# Patient Record
Sex: Male | Born: 1940 | Hispanic: Yes | Marital: Married | Smoking: Former smoker
Health system: Southern US, Community
[De-identification: ages and names within clinical notes are randomized; demographics above are authoritative.]

## PROBLEM LIST (undated history)

## (undated) DIAGNOSIS — I10 Essential (primary) hypertension: Secondary | ICD-10-CM

## (undated) DIAGNOSIS — Z8709 Personal history of other diseases of the respiratory system: Secondary | ICD-10-CM

## (undated) DIAGNOSIS — J449 Chronic obstructive pulmonary disease, unspecified: Secondary | ICD-10-CM

## (undated) DIAGNOSIS — Z87448 Personal history of other diseases of urinary system: Secondary | ICD-10-CM

## (undated) DIAGNOSIS — R0609 Other forms of dyspnea: Secondary | ICD-10-CM

## (undated) DIAGNOSIS — N401 Enlarged prostate with lower urinary tract symptoms: Secondary | ICD-10-CM

## (undated) DIAGNOSIS — N471 Phimosis: Secondary | ICD-10-CM

## (undated) DIAGNOSIS — Z86010 Personal history of colonic polyps: Secondary | ICD-10-CM

## (undated) DIAGNOSIS — Z9289 Personal history of other medical treatment: Secondary | ICD-10-CM

## (undated) DIAGNOSIS — M1990 Unspecified osteoarthritis, unspecified site: Secondary | ICD-10-CM

## (undated) DIAGNOSIS — N210 Calculus in bladder: Secondary | ICD-10-CM

## (undated) DIAGNOSIS — R0600 Dyspnea, unspecified: Secondary | ICD-10-CM

## (undated) DIAGNOSIS — K5730 Diverticulosis of large intestine without perforation or abscess without bleeding: Secondary | ICD-10-CM

---

## 2009-06-07 ENCOUNTER — Ambulatory Visit (HOSPITAL_COMMUNITY): Admission: RE | Admit: 2009-06-07 | Discharge: 2009-06-07 | Payer: Self-pay | Admitting: Urology

## 2009-06-07 ENCOUNTER — Encounter (INDEPENDENT_AMBULATORY_CARE_PROVIDER_SITE_OTHER): Payer: Self-pay | Admitting: Urology

## 2009-06-07 HISTORY — PX: CYSTOSCOPY WITH LITHOLAPAXY: SHX1425

## 2011-01-03 LAB — CBC
HCT: 47.6 % (ref 39.0–52.0)
Hemoglobin: 17.1 g/dL — ABNORMAL HIGH (ref 13.0–17.0)
MCH: 32.3 pg (ref 26.0–34.0)
MCV: 90 fL (ref 78.0–100.0)
Platelets: 176 10*3/uL (ref 150–400)
RBC: 5.29 MIL/uL (ref 4.22–5.81)

## 2011-01-03 LAB — BASIC METABOLIC PANEL
BUN: 15 mg/dL (ref 6–23)
CO2: 28 mEq/L (ref 19–32)
Chloride: 101 mEq/L (ref 96–112)
Creatinine, Ser: 1.02 mg/dL (ref 0.4–1.5)
Glucose, Bld: 95 mg/dL (ref 70–99)

## 2011-01-08 ENCOUNTER — Ambulatory Visit (HOSPITAL_COMMUNITY)
Admission: RE | Admit: 2011-01-08 | Discharge: 2011-01-08 | Payer: Self-pay | Source: Home / Self Care | Attending: General Surgery | Admitting: General Surgery

## 2011-01-08 HISTORY — PX: UMBILICAL HERNIA REPAIR: SHX196

## 2011-01-24 NOTE — Op Note (Signed)
NAMEAJ, CRUNKLETON ACCOUNT NO.:  [id number]  MEDICAL RECORD NO.:  [id number]          PATIENT TYPE:  AMB  LOCATION:  DAY                           FACILITY:  [facility]  PHYSICIAN:  Tilford Pillar, MD      DATE OF BIRTH:  [birth year]  DATE OF PROCEDURE:  01/08/2011 DATE OF DISCHARGE:                              OPERATIVE REPORT   PREOPERATIVE DIAGNOSIS:  Incarcerated umbilical hernia.  POSTOPERATIVE DIAGNOSIS:  Incarcerated umbilical hernia.  PROCEDURE:  Open umbilical hernia repair with primary closure.  SURGEON:  Tilford Pillar, MD  ANESTHESIA:  General endotracheal, local anesthetic 0.5% Sensorcaine plain.  SPECIMEN:  None.  ESTIMATED BLOOD LOSS:  Minimal.  INDICATIONS:  The patient is a 70 year old Hispanic male who presented to my office with a history of several years of umbilical hernia.  This was slowly increased in size and is now getting him some discomfort.  On evaluation, there was incarcerated material within the umbilical hernia that is nonreducible, no significant tenderness or erythema was encountered.  The risks, benefits, and alternatives of an open umbilical hernia repair with possible mesh was discussed at length with the patient and family.  Their questions and concerns were addressed and the patient was consented for the planned procedure.  OPERATION:  The patient was taken to the operating room, was placed in the supine position on the operating room table, at which time the general anesthetic was administered.  Once the patient was sleepy, the endotracheal was intubated by Anesthesia.  At this point, his abdomen was prepped with DuraPrep solution and draped in standard fashion.  A infraumbilical semilunar incision was created with a scalpel, additional dissection down to the subcuticular tissue was carried out using electrocautery and then this was carried out down to the fascia.  A hemostat was then utilized to bluntly dissect around the  hernia sac in umbilical stalk.  The umbilical skin was then dissected free from the hernia sac.  During the dissection, I did enter into the hernia sac. There was omental fat noted within the hernia sac.  The umbilical skin was completely dissected free from the underlying peritoneal sac and at this time, I cleared the fascia circumferentially around the redundant sac.  I did remove and discard some of the excess umbilical sac and at this point, I reduced the omental fat back into the peritoneal cavity. At this point, the defect was quite small, is approximately 1.5 cm in length, an easily able to be reapproximated.  I then used 0 Ethibond sutures in simple interrupted fashion to reapproximate the fascia.  I was quite pleased with the repair.  Local anesthetic was instilled.  The wound was irrigated and a 3-0 Vicryl suture was utilized to reapproximate the umbilical skin back to the fascia with the umbilical stalk re-pexed.  I then placed some additional 3-0 Vicryl sutures for reapproximate the deep subcuticular tissue.  The skin edges were reapproximated using a 4-0 Monocryl in a running subcuticular suture. The skin was washed and dried with a moist and dry towel.  Benzoin was applied around the incision.  A 0.5-inch Steri-Strips were placed.  The drapes were removed.  The patient was allowed to come out of  general anesthetic and was transferred back to regular hospital bed in stable condition.  At the conclusion of the procedure, all instrument, sponge, and needle counts were correct.  The patient tolerated the procedure extremely well.     Tilford Pillar, MD     BZ/MEDQ  D:  01/08/2011  T:  01/08/2011  Job:  [id number]  Electronically Signed by Tilford Pillar MD on 01/24/2011 11:10:15 AM

## 2011-03-19 LAB — BASIC METABOLIC PANEL
BUN: 17 mg/dL (ref 6–23)
CO2: 29 mEq/L (ref 19–32)
Chloride: 98 mEq/L (ref 96–112)
Glucose, Bld: 84 mg/dL (ref 70–99)
Potassium: 3.7 mEq/L (ref 3.5–5.1)

## 2011-03-19 LAB — STONE ANALYSIS: Stone Weight KSTONE: 2.801 g

## 2011-04-24 NOTE — H&P (Signed)
NAMEMerlene Cortez ACCOUNT NO.:  [id number]   MEDICAL RECORD NO.:  [id number]          PATIENT TYPE:  AMB   LOCATION:  DAY                           FACILITY:  [facility]   PHYSICIAN:  Ky Barban, M.D.DATE OF BIRTH:  [birth year]   DATE OF ADMISSION:  DATE OF DISCHARGE:  LH                              HISTORY & PHYSICAL   CHIEF COMPLAINT:  This patient is coming in the morning to have his  surgery done.   HISTORY OF PRESENT ILLNESS:  This patient is a 70 year old gentleman who  has a longstanding history of difficulty voiding.  He has been to the  emergency room in [place] where he had a CT scan done that shows that  he has normal upper tracts with multiple small stones in the bladder, so  this is the first time I have seen him in the office and he has a Foley  catheter.  I have advised him to undergo litholapaxy for which he will  be coming in the morning to [facility] so that I can do  litholapaxy as outpatient.   PAST MEDICAL HISTORY:  The patient does not speak English through the  interpreter.  She told me that he does not have any significant medical  or past medical problems.   EXAMINATION:  GENERAL:  Well-nourished, well-developed male not in acute  distress, fully conscious, alert, oriented.  VITAL SIGNS:  Blood pressure 108/68, temperature 97.  CENTRAL NERVOUS SYSTEM:  Negative.  HEAD/NECK/ENT: Negative.  CHEST:  Symmetrical.  HEART:  Regular sinus rhythm, no murmur.  ABDOMEN:  Soft, flat.  Liver, spleen, kidneys not palpable.  No CVA  tenderness.  EXTERNAL GENITALIA:  Uncircumcised.  Meatus adequate.  Testicles are  normal.  RECTAL EXAM:  Normal sphincter tone.  Prostate 1+ , smooth and firm.   IMPRESSION:  Bladder calculi.   PLAN:  Litholapaxy holmium as outpatient under anesthesia.      Ky Barban, M.D.  Electronically Signed     MIJ/MEDQ  D:  06/06/2009  T:  06/06/2009  Job:  [id number]

## 2011-04-24 NOTE — Op Note (Signed)
NAMEJENTRY, Martin Cortez ACCOUNT NO.:  [id number]   MEDICAL RECORD NO.:  [id number]          PATIENT TYPE:  AMB   LOCATION:  DAY                           FACILITY:  [facility]   PHYSICIAN:  Ky Barban, M.D.DATE OF BIRTH:  05/03/1941   DATE OF PROCEDURE:  DATE OF DISCHARGE:  06/07/2009                               OPERATIVE REPORT   PREOPERATIVE DIAGNOSIS:  Bladder stone.   POSTOPERATIVE DIAGNOSES:  Bladder stone and benign prostatic  hypertrophy.   PROCEDURE:  Litholapaxy.   ANESTHESIA:  Spinal.   PROCEDURE IN DETAIL:  The patient was placed in lithotomy position.  After usual prep and drape, a #25 cystoscope introduced into the  bladder, had large several calculi in the bladder, so I proceeded to  break them with the help of holmium laser, then I used 1000-micron  fiber, most of the stones were broken.  There were still large pieces.  Small pieces were evacuated with the help of the [facility] evacuator.  Now,  I changed the cystoscope, withdrew the mechanical lithotrite and the  remaining stones were broken with the help of the mechanical lithotrite,  all the pieces were evacuated.  At the end, I inspected the bladder and  there was no stone left in the bladder.  Of course, the bladder was  markedly inflamed and the prostatic urethra was completely obstructed  with trilobar hypertrophy.  Lithotrite was removed.  Then, I inserted a  #20 Foley catheter left in for drainage.  It was clear.  The patient  left the operating room in satisfactory condition.      Ky Barban, M.D.  Electronically Signed     MIJ/MEDQ  D:  06/07/2009  T:  06/08/2009  Job:  [id number]

## 2012-02-18 ENCOUNTER — Encounter (HOSPITAL_COMMUNITY): Admission: RE | Admit: 2012-02-18 | Discharge: 2012-02-18 | Payer: Medicare Other | Source: Ambulatory Visit

## 2012-02-18 NOTE — Patient Instructions (Signed)
20 Martin Cortez  02/18/2012   Your procedure is scheduled on:  02/21/2012  Report to [facility] at  0900  AM.  Call this number if you have problems the morning of surgery: [phone]   Remember:   Do not eat food:After Midnight.  May have clear liquids:until Midnight .  Clear liquids include soda, tea, black coffee, apple or grape juice, broth.  Take these medicines the morning of surgery with A SIP OF WATER: none   Do not wear jewelry, make-up or nail polish.  Do not wear lotions, powders, or perfumes. You may wear deodorant.  Do not shave 48 hours prior to surgery.  Do not bring valuables to the hospital.  Contacts, dentures or bridgework may not be worn into surgery.  Leave suitcase in the car. After surgery it may be brought to your room.  For patients admitted to the hospital, checkout time is 11:00 AM the day of discharge.   Patients discharged the day of surgery will not be allowed to drive home.  Name and phone number of your driver: family  Special Instructions: CHG Shower Use Special Wash: 1/2 bottle night before surgery and 1/2 bottle morning of surgery.   Please read over the following fact sheets that you were given: Pain Booklet, MRSA Information, Surgical Site Infection Prevention, Anesthesia Post-op Instructions and Care and Recovery After Surgery Cystoscopy (Bladder Exam) Care After Refer to this sheet in the next few weeks. These discharge instructions provide you with general information on caring for yourself after you leave the hospital. Your caregiver may also give you specific instructions. Your treatment has been planned according to the most current medical practices available, but unavoidable complications sometimes occur. If you have any problems or questions after discharge, please call your caregiver. AFTER THE PROCEDURE   There may be temporary bleeding and burning with urination.   Drink enough water and fluids to keep your urine clear or  pale yellow.  FINDING OUT THE RESULTS OF YOUR TEST Not all test results are available during your visit. If your test results are not back during the visit, make an appointment with your caregiver to find out the results. Do not assume everything is normal if you have not heard from your caregiver or the medical facility. It is important for you to follow up on all of your test results. SEEK IMMEDIATE MEDICAL CARE IF:   There is an increase in blood in the urine or you are passing clots.   There is difficulty passing urine.   You develop the chills.   You have an oral temperature above 102 F (38.9 C), not controlled by medicine.   Belly (abdominal) pain develops.  Document Released: 06/15/2005 Document Revised: 11/15/2011 Document Reviewed: 04/12/2008 [facility] Patient Information 2012 [place], [place].PATIENT INSTRUCTIONS POST-ANESTHESIA  IMMEDIATELY FOLLOWING SURGERY:  Do not drive or operate machinery for the first twenty four hours after surgery.  Do not make any important decisions for twenty four hours after surgery or while taking narcotic pain medications or sedatives.  If you develop intractable nausea and vomiting or a severe headache please notify your doctor immediately.  FOLLOW-UP:  Please make an appointment with your surgeon as instructed. You do not need to follow up with anesthesia unless specifically instructed to do so.  WOUND CARE INSTRUCTIONS (if applicable):  Keep a dry clean dressing on the anesthesia/puncture wound site if there is drainage.  Once the wound has quit draining you may leave it open to air.  Generally  you should leave the bandage intact for twenty four hours unless there is drainage.  If the epidural site drains for more than 36-48 hours please call the anesthesia department.  QUESTIONS?:  Please feel free to call your physician or the hospital operator if you have any questions, and they will be happy to assist you.     [facility] Anesthesia  Department [address] [place] [phone]

## 2012-02-20 ENCOUNTER — Encounter (HOSPITAL_COMMUNITY)
Admission: RE | Admit: 2012-02-20 | Discharge: 2012-02-20 | Disposition: A | Discharge status: Home / Self Care | Payer: Medicare Other | Source: Ambulatory Visit | Attending: Urology | Admitting: Urology

## 2012-02-20 ENCOUNTER — Encounter (HOSPITAL_COMMUNITY): Payer: Self-pay

## 2012-02-20 ENCOUNTER — Other Ambulatory Visit: Payer: Self-pay

## 2012-02-20 ENCOUNTER — Encounter (HOSPITAL_COMMUNITY): Payer: Self-pay | Admitting: Pharmacy Technician

## 2012-02-20 HISTORY — DX: Unspecified osteoarthritis, unspecified site: M19.90

## 2012-02-20 HISTORY — DX: Calculus in bladder: N21.0

## 2012-02-20 LAB — HEMOGLOBIN AND HEMATOCRIT, BLOOD
HCT: 48.6 % (ref 39.0–52.0)
Hemoglobin: 17.1 g/dL — ABNORMAL HIGH (ref 13.0–17.0)

## 2012-02-20 LAB — BASIC METABOLIC PANEL
CO2: 26 mEq/L (ref 19–32)
Calcium: 10 mg/dL (ref 8.4–10.5)
Chloride: 100 mEq/L (ref 96–112)
GFR calc Af Amer: >90 mL/min (ref >90)
Sodium: 136 mEq/L (ref 135–145)

## 2012-02-20 LAB — SURGICAL PCR SCREEN: Staphylococcus aureus: NEGATIVE

## 2012-02-20 NOTE — H&P (Signed)
Martin Cortez, GREENHAW ACCOUNT NO.:  [id number]  MEDICAL RECORD NO.:  [id number]  LOCATION:                                 FACILITY:  PHYSICIAN:  Ky Barban, M.D.DATE OF BIRTH:  Nov 22, 1941  DATE OF ADMISSION:  02/21/2012 DATE OF DISCHARGE:  LH                             HISTORY & PHYSICAL   CHIEF COMPLAINT:  Bladder stone.  HISTORY:  A 71 year old gentleman who had a bladder stone in 2010.  I have done litholapaxy.  He does have BPH also.  The stone was completely removed.  He did okay for a few months, then he started to have frequency and urgency, and now the workup has shown that he has developed a large stone in the bladder and I told him we will remove the stone and I will reassess him to see me if he has BPH, and if he is not emptying the bladder, then he probably needed TUR of prostate at a later date.  No fever, chills, or gross hematuria.  PAST HISTORY:  Includes, I have done holmium laser litholapaxy 2010. Past history otherwise unremarkable.  I had done a followup CT on him on May 06, 2009, and at that time, he had multiple bladder calculi at that time, which I removed them, but he has developed recurrent bladder stone.  The patient himself does not speak any [place].  He brings an interpreter with him, and it is very difficult to assess his history.  PERSONAL HISTORY:  Does not smoke or drink.  REVIEW OF SYSTEMS:  Unremarkable.  PHYSICAL EXAMINATION:  GENERAL:  Moderately built, not in acute distress, fully conscious, alert, oriented. VITAL SIGNS:  Blood pressure 120/80, temperature is normal. CENTRAL NERVOUS SYSTEM:  No gross neurological deficit. HEAD, NECK, EYE, ENT:  Negative. CHEST:  Symmetrical. HEART:  Regular sinus rhythm.  No murmur. ABDOMEN:  Soft, flat.  Liver, spleen, and kidneys are not palpable.  No CVA tenderness. GENITOURINARY:  External genitalia is uncircumcised, meatus adequate. Testicles are normal. RECTAL:  No rectal  mass.  Prostate 1-1/2+, smooth, and firm.  IMPRESSION: 1. Recurrent bladder calculus. 2. Possible benign prostatic hypertrophy.  Recommend litholapaxy under anesthesia as outpatient.     Ky Barban, M.D.     MIJ/MEDQ  D:  02/20/2012  T:  02/20/2012  Job:  [id number]

## 2012-02-20 NOTE — Progress Notes (Signed)
02/20/12 0954  OBSTRUCTIVE SLEEP APNEA  Score 4 or greater  Updated health history;Results sent to PCP

## 2012-02-20 NOTE — Patient Instructions (Signed)
C20 Martin Cortez  02/20/2012   Your procedure is scheduled on:  Thursday, 02/21/12  Report to [facility] at 0900 AM.  Call this number if you have problems the morning of surgery: [phone]   Remember:   Do not eat food:After Midnight.  May have clear liquids:until Midnight .  Clear liquids include soda, tea, black coffee, apple or grape juice, broth.  Take these medicines the morning of surgery with A SIP OF WATER: none   Do not wear jewelry, make-up or nail polish.  Do not wear lotions, powders, or perfumes. You may wear deodorant.  Do not shave 48 hours prior to surgery.  Do not bring valuables to the hospital.  Contacts, dentures or bridgework may not be worn into surgery.  Leave suitcase in the car. After surgery it may be brought to your room.  For patients admitted to the hospital, checkout time is 11:00 AM the day of discharge.   Patients discharged the day of surgery will not be allowed to drive home.  Name and phone number of your driver: driver  Special Instructions: CHG Shower Use Special Wash: 1/2 bottle night before surgery and 1/2 bottle morning of surgery.   Please read over the following fact sheets that you were given: Pain Booklet, MRSA Information, Surgical Site Infection Prevention, Anesthesia Post-op Instructions and Care and Recovery After Surgery   Cistoscopa (examen de la vejiga) Cuidados posteriores (Cystoscopy [Bladder Exam], Care After) Consulte este texto durante las prximas semanas. Estas indicaciones para despus de recibir el alta le proporcionan informacin general acerca de cmo deber cuidarse despus de dejar el hospital. El mdico le dar instrucciones especficas. El tratamiento se ha planificado de acuerdo con las prcticas mdicas disponibles ms recientes, pero ocasionalmente pueden ocurrir complicaciones inevitables. Si tiene problemas o surgen preguntas luego de recibir el alta, por favor comunquese con su mdico. DESPUES DEL  PROCEDIMIENTO  Puede ocurrirle que por un tiempo presente hemorragia y ardor al Geographical information systems officer.   Beba gran cantidad de lquido para mantener la orina de tono claro o color amarillo plido.  AVERIGE LOS RESULTADOS DE SU ANLISIS Durante su visita no contar con todos los [employer]. En este caso, tenga otra entrevista con su mdico para conocerlos. No piense que el resultado es normal si no tiene noticias de su mdico o de la institucin mdica. Es Copy seguimiento de todos los [place]. SOLICITE ATENCIN MDICA INMEDIATAMENTE SI:  Aumenta la cantidad de sangre en la orina o si elimina cogulos.   Siente dificultad para orinar.   Siente escalofros.   La temperatura oral le sube a ms de 102 F (38.9 C) y no puede bajarla con medicamentos.   Siente dolor en el vientre (abdominal).  Document Released: 05/16/2010 Document Revised: 11/15/2011 [facility] Patient Information 2012 [place], [place].

## 2012-02-21 ENCOUNTER — Ambulatory Visit (HOSPITAL_COMMUNITY): Payer: Medicare Other | Admitting: Anesthesiology

## 2012-02-21 ENCOUNTER — Encounter (HOSPITAL_COMMUNITY)
Admission: RE | Disposition: A | Discharge status: Home / Self Care | Payer: Self-pay | Source: Ambulatory Visit | Attending: Urology

## 2012-02-21 ENCOUNTER — Encounter (HOSPITAL_COMMUNITY): Payer: Self-pay | Admitting: Anesthesiology

## 2012-02-21 ENCOUNTER — Encounter (HOSPITAL_COMMUNITY): Payer: Self-pay | Admitting: *Deleted

## 2012-02-21 ENCOUNTER — Ambulatory Visit (HOSPITAL_COMMUNITY)
Admission: RE | Admit: 2012-02-21 | Discharge: 2012-02-21 | Disposition: A | Discharge status: Home / Self Care | Payer: Medicare Other | Source: Ambulatory Visit | Attending: Urology | Admitting: Urology

## 2012-02-21 DIAGNOSIS — Z01812 Encounter for preprocedural laboratory examination: Secondary | ICD-10-CM | POA: Insufficient documentation

## 2012-02-21 DIAGNOSIS — N400 Enlarged prostate without lower urinary tract symptoms: Secondary | ICD-10-CM | POA: Insufficient documentation

## 2012-02-21 DIAGNOSIS — N210 Calculus in bladder: Secondary | ICD-10-CM

## 2012-02-21 DIAGNOSIS — Z01810 Encounter for preprocedural cardiovascular examination: Secondary | ICD-10-CM | POA: Insufficient documentation

## 2012-02-21 HISTORY — PX: CYSTOSCOPY WITH LITHOLAPAXY: SHX1425

## 2012-02-21 LAB — GLUCOSE, CAPILLARY: Glucose-Capillary: 118 mg/dL — ABNORMAL HIGH (ref 70–99)

## 2012-02-21 SURGERY — CYSTOSCOPY, WITH BLADDER CALCULUS LITHOLAPAXY
Anesthesia: General | Site: Bladder | Wound class: Clean Contaminated

## 2012-02-21 MED ORDER — PROPOFOL 10 MG/ML IV EMUL
INTRAVENOUS | Status: DC | PRN
  Administered 2012-02-21: 200 mg via INTRAVENOUS

## 2012-02-21 MED ORDER — SODIUM CHLORIDE 0.9 % IR SOLN
Status: DC | PRN
  Administered 2012-02-21 (×5): 3000 mL

## 2012-02-21 MED ORDER — ONDANSETRON HCL 4 MG/2ML IJ SOLN
4.0000 mg | Freq: Once | INTRAMUSCULAR | Status: AC | PRN
  Administered 2012-02-21: 4 mg via INTRAVENOUS

## 2012-02-21 MED ORDER — GENTAMICIN SULFATE 40 MG/ML IJ SOLN
INTRAMUSCULAR | Status: AC
  Filled 2012-02-21: qty 2

## 2012-02-21 MED ORDER — FENTANYL CITRATE 0.05 MG/ML IJ SOLN
INTRAMUSCULAR | Status: AC
  Filled 2012-02-21: qty 5

## 2012-02-21 MED ORDER — PROPOFOL 10 MG/ML IV EMUL
INTRAVENOUS | Status: AC
  Filled 2012-02-21: qty 20

## 2012-02-21 MED ORDER — FENTANYL CITRATE 0.05 MG/ML IJ SOLN
INTRAMUSCULAR | Status: AC
Start: 2012-02-21 — End: 2012-02-21
  Administered 2012-02-21: 25 ug via INTRAVENOUS
  Filled 2012-02-21: qty 2

## 2012-02-21 MED ORDER — STERILE WATER FOR IRRIGATION IR SOLN
Status: DC | PRN
  Administered 2012-02-21 (×2): 1000 mL
  Administered 2012-02-21: 2000 mL

## 2012-02-21 MED ORDER — FENTANYL CITRATE 0.05 MG/ML IJ SOLN
INTRAMUSCULAR | Status: DC | PRN
  Administered 2012-02-21 (×2): 25 ug via INTRAVENOUS
  Administered 2012-02-21: 50 ug via INTRAVENOUS
  Administered 2012-02-21 (×2): 25 ug via INTRAVENOUS
  Administered 2012-02-21: 50 ug via INTRAVENOUS
  Administered 2012-02-21 (×3): 25 ug via INTRAVENOUS

## 2012-02-21 MED ORDER — MIDAZOLAM HCL 2 MG/2ML IJ SOLN
INTRAMUSCULAR | Status: AC
  Filled 2012-02-21: qty 2

## 2012-02-21 MED ORDER — CEFAZOLIN SODIUM 1 G IJ SOLR
INTRAMUSCULAR | Status: AC
  Filled 2012-02-21: qty 10

## 2012-02-21 MED ORDER — MIDAZOLAM HCL 2 MG/2ML IJ SOLN
1.0000 mg | INTRAMUSCULAR | Status: DC | PRN
  Administered 2012-02-21: 2 mg via INTRAVENOUS

## 2012-02-21 MED ORDER — CIPROFLOXACIN HCL 250 MG PO TABS: 250.0000 mg | ORAL_TABLET | Freq: Two times a day (BID) | ORAL | Status: AC

## 2012-02-21 MED ORDER — DEXAMETHASONE SODIUM PHOSPHATE 10 MG/ML IJ SOLN
INTRAMUSCULAR | Status: AC
  Administered 2012-02-21: 10 mg via INTRAVENOUS
  Filled 2012-02-21: qty 1

## 2012-02-21 MED ORDER — ONDANSETRON HCL 4 MG/2ML IJ SOLN
INTRAMUSCULAR | Status: AC
  Administered 2012-02-21: 4 mg via INTRAVENOUS
  Filled 2012-02-21: qty 2

## 2012-02-21 MED ORDER — DEXAMETHASONE SODIUM PHOSPHATE 10 MG/ML IJ SOLN
10.0000 mg | Freq: Once | INTRAMUSCULAR | Status: AC
  Administered 2012-02-21: 10 mg via INTRAVENOUS

## 2012-02-21 MED ORDER — LACTATED RINGERS IV SOLN
INTRAVENOUS | Status: DC
  Administered 2012-02-21: 10:00:00 via INTRAVENOUS

## 2012-02-21 MED ORDER — CEFAZOLIN SODIUM 1-5 GM-% IV SOLN
INTRAVENOUS | Status: DC | PRN
  Administered 2012-02-21: 1 g via INTRAVENOUS

## 2012-02-21 MED ORDER — LACTATED RINGERS IV SOLN
INTRAVENOUS | Status: DC | PRN
  Administered 2012-02-21 (×2): via INTRAVENOUS

## 2012-02-21 MED ORDER — ONDANSETRON HCL 4 MG/2ML IJ SOLN
INTRAMUSCULAR | Status: DC | PRN
  Administered 2012-02-21: 4 mg via INTRAVENOUS

## 2012-02-21 MED ORDER — GENTAMICIN IN SALINE 1.6-0.9 MG/ML-% IV SOLN
INTRAVENOUS | Status: DC | PRN
  Administered 2012-02-21: 80 mg via INTRAVENOUS

## 2012-02-21 MED ORDER — FENTANYL CITRATE 0.05 MG/ML IJ SOLN
25.0000 ug | INTRAMUSCULAR | Status: DC | PRN
  Administered 2012-02-21 (×2): 25 ug via INTRAVENOUS

## 2012-02-21 MED ORDER — ONDANSETRON HCL 4 MG/2ML IJ SOLN
INTRAMUSCULAR | Status: AC
  Filled 2012-02-21: qty 2

## 2012-02-21 MED ORDER — OXYCODONE-ACETAMINOPHEN 7.5-325 MG PO TABS: 1.0000 | ORAL_TABLET | ORAL | Status: AC | PRN

## 2012-02-21 MED ORDER — FENTANYL CITRATE 0.05 MG/ML IJ SOLN
INTRAMUSCULAR | Status: AC
  Administered 2012-02-21: 25 ug via INTRAVENOUS
  Filled 2012-02-21: qty 2

## 2012-02-21 SURGICAL SUPPLY — 24 items
BAG DRAIN URO TABLE W/ADPT NS (DRAPE) ×2 IMPLANT
BAG HAMPER (MISCELLANEOUS) ×2 IMPLANT
BAG URINE DRAINAGE (UROLOGICAL SUPPLIES) ×2 IMPLANT
CATH FOLEY 2WAY SLVR  5CC 18FR (CATHETERS) ×1
CATH FOLEY 2WAY SLVR 5CC 18FR (CATHETERS) ×1 IMPLANT
CLOTH BEACON ORANGE TIMEOUT ST (SAFETY) ×2 IMPLANT
COVER LIGHT HANDLE STERIS (MISCELLANEOUS) ×4 IMPLANT
DILATOR BALLN URETERAL SET (BALLOONS) ×2 IMPLANT
EVACUATOR ELLICK (MISCELLANEOUS) ×2 IMPLANT
GLOVE BIO SURGEON STRL SZ7 (GLOVE) ×2 IMPLANT
GLOVE INDICATOR 7.0 STRL GRN (GLOVE) ×6 IMPLANT
GLOVE SS BIOGEL STRL SZ 6.5 (GLOVE) ×2 IMPLANT
GLOVE SUPERSENSE BIOGEL SZ 6.5 (GLOVE) ×2
GOWN STRL REIN XL XLG (GOWN DISPOSABLE) ×4 IMPLANT
IV NS IRRIG 3000ML ARTHROMATIC (IV SOLUTION) ×10 IMPLANT
KIT ROOM TURNOVER AP CYSTO (KITS) ×2 IMPLANT
LASER FIBER DISP (UROLOGICAL SUPPLIES) IMPLANT
LASER FIBER DISP 1000U (UROLOGICAL SUPPLIES) ×2 IMPLANT
MANIFOLD NEPTUNE II (INSTRUMENTS) ×2 IMPLANT
NS IRRIG 1000ML POUR BTL (IV SOLUTION) ×8 IMPLANT
PACK CYSTO (CUSTOM PROCEDURE TRAY) ×2 IMPLANT
PAD ARMBOARD 7.5X6 YLW CONV (MISCELLANEOUS) ×2 IMPLANT
TOWEL OR 17X26 4PK STRL BLUE (TOWEL DISPOSABLE) ×2 IMPLANT
YANKAUER SUCT BULB TIP 10FT TU (MISCELLANEOUS) ×4 IMPLANT

## 2012-02-21 NOTE — Preoperative (Signed)
Beta Blockers   Reason not to administer Beta Blockers:Not Applicable 

## 2012-02-21 NOTE — Transfer of Care (Signed)
Immediate Anesthesia Transfer of Care Note  Patient: Martin Cortez  Procedure(s) Performed: Procedure(s) (LRB): CYSTOSCOPY WITH LITHOLAPAXY (N/A) HOLMIUM LASER APPLICATION (N/A)  Patient Location: PACU  Anesthesia Type: General  Level of Consciousness: awake and alert   Airway & Oxygen Therapy: Patient Spontanous Breathing and Patient connected to face mask oxygen  Post-op Assessment: Report given to PACU RN and Post -op Vital signs reviewed and stable  Post vital signs: Reviewed and stable  Complications: No apparent anesthesia complications

## 2012-02-21 NOTE — Anesthesia Preprocedure Evaluation (Addendum)
Anesthesia Evaluation  Patient identified by MRN, date of birth, ID band Patient awake    Reviewed: Allergy & Precautions, H&P , NPO status , Patient's Chart, lab work & pertinent test results  History of Anesthesia Complications Negative for: history of anesthetic complications  Airway Mallampati: II      Dental  (+) Teeth Intact   Pulmonary sleep apnea (screen pos, not diagnosed) , former smoker breath sounds clear to auscultation        Cardiovascular negative cardio ROS  Rhythm:Regular Rate:Normal     Neuro/Psych    GI/Hepatic GERD- (only when earts chilli)  Medicated and Controlled,  Endo/Other    Renal/GU      Musculoskeletal   Abdominal   Peds  Hematology   Anesthesia Other Findings   Reproductive/Obstetrics                          Anesthesia Physical Anesthesia Plan  ASA: II  Anesthesia Plan: General   Post-op Pain Management:    Induction: Intravenous  Airway Management Planned: LMA  Additional Equipment:   Intra-op Plan:   Post-operative Plan: Extubation in OR  Informed Consent: I have reviewed the patients History and Physical, chart, labs and discussed the procedure including the risks, benefits and alternatives for the proposed anesthesia with the patient or authorized representative who has indicated his/her understanding and acceptance.     Plan Discussed with:   Anesthesia Plan Comments:        Anesthesia Quick Evaluation

## 2012-02-21 NOTE — Progress Notes (Signed)
Pt states nausea is better.  

## 2012-02-21 NOTE — Brief Op Note (Signed)
02/21/2012  12:49 PM  PATIENT:  Raef Olivarria-Barbosa  71 y.o. male  PRE-OPERATIVE DIAGNOSIS:  bladder calculus  POST-OPERATIVE DIAGNOSIS:  bladder calculus; benign prostatic hypertrophy  PROCEDURE:  Procedure(s) (LRB): CYSTOSCOPY WITH LITHOLAPAXY (N/A) HOLMIUM LASER APPLICATION (N/A)  SURGEON:  Surgeon(s) and Role:    * Ky Barban, MD - Primary  PHYSICIAN ASSISTANT:   ASSISTANTS: none   general  EBL:  Total I/O In: 1200 [I.V.:1200] Out: -   BLOOD ADMINISTERED:none  DRAINS: Urinary Catheter (Foley)   LOCAL MEDICATIONS USED:  NONE  SPECIMEN:  Source of Specimen:  bladder stone  DISPOSITION OF SPECIMEN:  PATHOLOGY  COUNTS:  YES  TOURNIQUET:  * No tourniquets in log *  DICTATION: #[id number]  PLAN OF CARE:  PATIENT DISPOSITION:  PACU - hemodynamically stable.   Delay start of Pharmacological VTE agent (>24hrs) due to surgical blood loss or risk of bleeding:

## 2012-02-21 NOTE — Progress Notes (Signed)
No change in H&P on reexamination. 

## 2012-02-21 NOTE — Anesthesia Postprocedure Evaluation (Signed)
  Anesthesia Post-op Note  Patient: Martin Cortez  Procedure(s) Performed: Procedure(s) (LRB): CYSTOSCOPY WITH LITHOLAPAXY (N/A) HOLMIUM LASER APPLICATION (N/A)  Patient Location: PACU  Anesthesia Type: General  Level of Consciousness: awake and alert   Airway and Oxygen Therapy: Patient Spontanous Breathing and Patient connected to face mask oxygen  Post-op Pain: none  Post-op Assessment: Post-op Vital signs reviewed, Patient's Cardiovascular Status Stable, Respiratory Function Stable, Patent Airway and No signs of Nausea or vomiting  Post-op Vital Signs: Reviewed and stable  Complications: No apparent anesthesia complications

## 2012-02-22 NOTE — Op Note (Signed)
Martin Cortez, Martin Cortez ACCOUNT NO.:  [id number]  MEDICAL RECORD NO.:  [id number]  LOCATION:  APPO                          FACILITY:  [facility]  PHYSICIAN:  Ky Barban, M.D.DATE OF BIRTH:  12/13/1940  DATE OF PROCEDURE: DATE OF DISCHARGE:                              OPERATIVE REPORT   PREOPERATIVE DIAGNOSIS:  Large bladder stone.  POSTOPERATIVE DIAGNOSIS:  Large bladder stone plus benign prostatic hypertrophy.  PROCEDURE:  Litholapaxy, holmium laser, mechanical lithotrite.  ANESTHESIA:  General.  PROCEDURE IN DETAIL:  The patient under spinal anesthesia, after usual prep and drape.  Laser scope was introduced into the bladder.  So I inspected the bladder and the bladder stone was seen.  I started the treatment with holmium laser using 1000 micron laser fiber.  Most of the stone was broken.  Then near the end, there was still large pieces.  I used 500 micron fiber because we did not have 1000 micron and the remaining stone was completely broken into smaller pieces, and then I introduced mechanical lithotrite and broken the rest of the pieces and they were evacuated with the help of Ellik evacuator.  At the end, the bladder was inspected, looks fine.  There are still considerable stone pieces attached to the bladder.  The patient has BPH with bladder neck obstruction.  At a later date, I will have to do a TUR prostate and that could be the reason that he has a recurrent stone formation but there is no free stone or any other piece of stone left in the bladder and all the instruments were removed.  A #18 Foley catheter left in for drainage.  The patient left the operating room in satisfactory condition.     Ky Barban, M.D.     MIJ/MEDQ  D:  02/21/2012  T:  02/22/2012  Job:  [id number]

## 2012-02-25 ENCOUNTER — Encounter (HOSPITAL_COMMUNITY): Payer: Self-pay | Admitting: Urology

## 2012-02-28 LAB — STONE ANALYSIS: Stone Weight KSTONE: 3.838 g

## 2012-02-28 NOTE — Addendum Note (Signed)
Addendum  created 02/28/12 1557 by Franco Nones, CRNA   Modules edited:Charges VN

## 2014-08-11 ENCOUNTER — Other Ambulatory Visit (HOSPITAL_COMMUNITY): Payer: Self-pay | Admitting: Urology

## 2014-08-11 DIAGNOSIS — N390 Urinary tract infection, site not specified: Secondary | ICD-10-CM

## 2014-08-11 DIAGNOSIS — R39198 Other difficulties with micturition: Secondary | ICD-10-CM

## 2014-08-11 DIAGNOSIS — N400 Enlarged prostate without lower urinary tract symptoms: Secondary | ICD-10-CM

## 2014-08-13 ENCOUNTER — Ambulatory Visit (HOSPITAL_COMMUNITY)
Admission: RE | Admit: 2014-08-13 | Discharge: 2014-08-13 | Disposition: A | Discharge status: Home / Self Care | Payer: Medicare Other | Source: Ambulatory Visit | Attending: Urology | Admitting: Urology

## 2014-08-13 DIAGNOSIS — N400 Enlarged prostate without lower urinary tract symptoms: Secondary | ICD-10-CM | POA: Insufficient documentation

## 2014-08-13 DIAGNOSIS — N390 Urinary tract infection, site not specified: Secondary | ICD-10-CM | POA: Diagnosis not present

## 2014-08-13 DIAGNOSIS — R39198 Other difficulties with micturition: Secondary | ICD-10-CM

## 2016-01-10 ENCOUNTER — Telehealth: Payer: Self-pay

## 2016-01-10 NOTE — Telephone Encounter (Signed)
[place] primary called to follow up on referral for a triage. I told the lady that the triage mailed a letter in November for the patient to call the nurse. Pt was confused and thought they had an appointment somewhere in [place]. I told her they did not have appointment scheduled  With [place] and they needed to call the triage nurse. Pt doesn't speak english that well and I told the girl at the PCP office that I would have the triage nurse call the patient and ask for the daughter that does speak [employer] cabin crew)

## 2016-01-10 NOTE — Telephone Encounter (Signed)
Fax to PCP with date and time of procedure.  See separate triage.

## 2016-01-11 ENCOUNTER — Other Ambulatory Visit: Payer: Self-pay

## 2016-01-11 DIAGNOSIS — Z1211 Encounter for screening for malignant neoplasm of colon: Secondary | ICD-10-CM

## 2016-01-11 MED ORDER — PEG 3350-KCL-NA BICARB-NACL 420 G PO SOLR: 4000.0000 mL | ORAL | Status: DC

## 2016-01-11 NOTE — Telephone Encounter (Signed)
Ok to schedule.  No anticoagulants, anxiolytics, chronic pain medications, or excessive ETOH use. No GI symptoms noted in triage.

## 2016-01-11 NOTE — Telephone Encounter (Signed)
Rx sent to the pharmacy and instructions mailed to pt.  

## 2016-01-11 NOTE — Telephone Encounter (Signed)
Gastroenterology Pre-Procedure Review  Request Date: 01/10/2016 Requesting Physician: Dr. Barry Dienes  PATIENT REVIEW QUESTIONS: The patient responded to the following health history questions as indicated:    INFORMATION WAS GIVEN BY PT'S DAUGHTER, CECELIA, WHO WILL ACCOMPANY HIM TO [place]   1. Diabetes Melitis: no 2. Joint replacements in the past 12 months: no 3. Major health problems in the past 3 months: no 4. Has an artificial valve or MVP: no 5. Has a defibrillator: no 6. Has been advised in past to take antibiotics in advance of a procedure like teeth cleaning: no 7. Family history of colon cancer: no  8. Alcohol Use: no 9. History of sleep apnea: no     MEDICATIONS & ALLERGIES:    Patient reports the following regarding taking any blood thinners:   Plavix? no Aspirin? no Coumadin? no  Patient confirms/reports the following medications:  Current Outpatient Prescriptions  Medication Sig Dispense Refill  . Fluticasone-Salmeterol (ADVAIR) 250-50 MCG/DOSE AEPB Inhale 1 puff into the lungs 2 (two) times daily.    . NON FORMULARY Pro Air    As directed    . simvastatin (ZOCOR) 40 MG tablet Take 40 mg by mouth daily.    Marland Kitchen triamterene-hydrochlorothiazide (MAXZIDE-25) 37.5-25 MG tablet Take 1 tablet by mouth daily.     No current facility-administered medications for this visit.    Patient confirms/reports the following allergies:  No Known Allergies  No orders of the defined types were placed in this encounter.    AUTHORIZATION INFORMATION Primary Insurance:   ID #:  Group #:  Pre-Cert / Auth required:  Pre-Cert / Auth #:   Secondary Insurance:   ID #:   Group #:  Pre-Cert / Auth required:  Pre-Cert / Auth #:   SCHEDULE INFORMATION: Procedure has been scheduled as follows:  Date: 02/01/2016             Time:  11:30 am Location: [facility] Short Stay  This Gastroenterology Pre-Precedure Review Form is being routed to the following  provider(s): R. Garfield Cornea, MD

## 2016-02-01 ENCOUNTER — Ambulatory Visit (HOSPITAL_COMMUNITY)
Admission: RE | Admit: 2016-02-01 | Discharge: 2016-02-01 | Disposition: A | Discharge status: Home / Self Care | Payer: Medicare Other | Source: Ambulatory Visit | Attending: Internal Medicine | Admitting: Internal Medicine

## 2016-02-01 ENCOUNTER — Encounter (HOSPITAL_COMMUNITY)
Admission: RE | Disposition: A | Discharge status: Home / Self Care | Payer: Self-pay | Source: Ambulatory Visit | Attending: Internal Medicine

## 2016-02-01 SURGERY — CANCELLED PROCEDURE

## 2016-02-01 MED ORDER — MIDAZOLAM HCL 5 MG/5ML IJ SOLN
INTRAMUSCULAR | Status: AC
  Filled 2016-02-01: qty 10

## 2016-02-01 MED ORDER — ONDANSETRON HCL 4 MG/2ML IJ SOLN
INTRAMUSCULAR | Status: DC
Start: 2016-02-01 — End: 2016-02-01
  Filled 2016-02-01: qty 2

## 2016-02-01 MED ORDER — MEPERIDINE HCL 100 MG/ML IJ SOLN
INTRAMUSCULAR | Status: AC
  Filled 2016-02-01: qty 2

## 2016-02-01 NOTE — OR Nursing (Signed)
Per patients daughter, they did not know what he was to have done today and he did not do any type of bowel prep.  Dr. Roseanne Kaufman office was notified and they will contact the patient to reschedule.

## 2016-02-06 ENCOUNTER — Telehealth: Payer: Self-pay

## 2016-02-06 NOTE — Telephone Encounter (Signed)
I called pt's daugther, Lorna Few, to find out why he did not get to do his colonoscopy. She had stated when I triaged pt that she would be with him and help him with his instructions.  Today, she tells me that his other daughter is with him now, Verdis Frederickson [phone]) and she will be the one to help him with instructions and bring him to the hospital. I called Verdis Frederickson and rescheduled pt for 03/07/2016 at 10:30 with Dr. Oneida Alar. I will print up new instructions, check on his previous prep and call Verdis Frederickson back and go over the instructions and update meds.

## 2016-02-13 ENCOUNTER — Other Ambulatory Visit: Payer: Self-pay

## 2016-02-13 DIAGNOSIS — Z1211 Encounter for screening for malignant neoplasm of colon: Secondary | ICD-10-CM

## 2016-02-13 NOTE — Telephone Encounter (Signed)
I called pt's daughter, Verdis Frederickson. She said there has been no change in his meds. He is rescheduled to 03/08/2016 at 9:30 Am with Dr. Gala Romney. ( I had inadvertently put with Dr. Oneida Alar and it was supposed to be with Dr. Gala Romney. Verdis Frederickson is aware of date and time.  She has picked up the prescription and I told her I am mailing the instructions. Please call me when she gets the instructions and I will go over them if she has any questions.  FYI to Walden Field, NP since he signed off on the previous triage.

## 2016-02-14 NOTE — Telephone Encounter (Signed)
Noted, thanks!

## 2016-03-08 ENCOUNTER — Encounter (HOSPITAL_COMMUNITY)
Admission: RE | Disposition: A | Discharge status: Home / Self Care | Payer: Self-pay | Source: Ambulatory Visit | Attending: Internal Medicine

## 2016-03-08 ENCOUNTER — Ambulatory Visit (HOSPITAL_COMMUNITY)
Admission: RE | Admit: 2016-03-08 | Discharge: 2016-03-08 | Disposition: A | Discharge status: Home / Self Care | Payer: Medicare Other | Source: Ambulatory Visit | Attending: Internal Medicine | Admitting: Internal Medicine

## 2016-03-08 ENCOUNTER — Encounter (HOSPITAL_COMMUNITY): Payer: Self-pay

## 2016-03-08 DIAGNOSIS — Z860100 Personal history of colon polyps, unspecified: Secondary | ICD-10-CM | POA: Insufficient documentation

## 2016-03-08 DIAGNOSIS — G4730 Sleep apnea, unspecified: Secondary | ICD-10-CM | POA: Diagnosis not present

## 2016-03-08 DIAGNOSIS — Z79899 Other long term (current) drug therapy: Secondary | ICD-10-CM | POA: Insufficient documentation

## 2016-03-08 DIAGNOSIS — M1991 Primary osteoarthritis, unspecified site: Secondary | ICD-10-CM | POA: Insufficient documentation

## 2016-03-08 DIAGNOSIS — Z1211 Encounter for screening for malignant neoplasm of colon: Secondary | ICD-10-CM | POA: Diagnosis not present

## 2016-03-08 DIAGNOSIS — D124 Benign neoplasm of descending colon: Secondary | ICD-10-CM | POA: Diagnosis not present

## 2016-03-08 DIAGNOSIS — K5730 Diverticulosis of large intestine without perforation or abscess without bleeding: Secondary | ICD-10-CM | POA: Insufficient documentation

## 2016-03-08 DIAGNOSIS — D122 Benign neoplasm of ascending colon: Secondary | ICD-10-CM | POA: Insufficient documentation

## 2016-03-08 HISTORY — DX: Personal history of colonic polyps: Z86.010

## 2016-03-08 HISTORY — PX: COLONOSCOPY: SHX5424

## 2016-03-08 SURGERY — COLONOSCOPY
Anesthesia: Moderate Sedation

## 2016-03-08 MED ORDER — MIDAZOLAM HCL 5 MG/5ML IJ SOLN
INTRAMUSCULAR | Status: AC
  Filled 2016-03-08: qty 10

## 2016-03-08 MED ORDER — SODIUM CHLORIDE 0.9 % IV SOLN
INTRAVENOUS | Status: DC
  Administered 2016-03-08: 09:00:00 via INTRAVENOUS

## 2016-03-08 MED ORDER — SIMETHICONE 40 MG/0.6ML PO SUSP
ORAL | Status: DC | PRN
  Administered 2016-03-08: 10:00:00

## 2016-03-08 MED ORDER — ONDANSETRON HCL 4 MG/2ML IJ SOLN
INTRAMUSCULAR | Status: DC | PRN
  Administered 2016-03-08: 4 mg via INTRAVENOUS

## 2016-03-08 MED ORDER — MEPERIDINE HCL 100 MG/ML IJ SOLN
INTRAMUSCULAR | Status: DC | PRN
  Administered 2016-03-08: 50 mg via INTRAVENOUS
  Administered 2016-03-08: 25 mg via INTRAVENOUS

## 2016-03-08 MED ORDER — MEPERIDINE HCL 100 MG/ML IJ SOLN
INTRAMUSCULAR | Status: AC
  Filled 2016-03-08: qty 2

## 2016-03-08 MED ORDER — ONDANSETRON HCL 4 MG/2ML IJ SOLN
INTRAMUSCULAR | Status: AC
  Filled 2016-03-08: qty 2

## 2016-03-08 MED ORDER — MIDAZOLAM HCL 5 MG/5ML IJ SOLN
INTRAMUSCULAR | Status: DC | PRN
  Administered 2016-03-08: 1 mg via INTRAVENOUS
  Administered 2016-03-08 (×2): 2 mg via INTRAVENOUS

## 2016-03-08 NOTE — OR Nursing (Signed)
Translator Consuello [employer]

## 2016-03-08 NOTE — Discharge Instructions (Signed)
Colonoscopy Discharge Instructions  Read the instructions outlined below and refer to this sheet in the next few weeks. These discharge instructions provide you with general information on caring for yourself after you leave the hospital. Your doctor may also give you specific instructions. While your treatment has been planned according to the most current medical practices available, unavoidable complications occasionally occur. If you have any problems or questions after discharge, call Dr. Gala Romney at [phone]. ACTIVITY  You may resume your regular activity, but move at a slower pace for the next 24 hours.   Take frequent rest periods for the next 24 hours.   Walking will help get rid of the air and reduce the bloated feeling in your belly (abdomen).   No driving for 24 hours (because of the medicine (anesthesia) used during the test).    Do not sign any important legal documents or operate any machinery for 24 hours (because of the anesthesia used during the test).  NUTRITION  Drink plenty of fluids.   You may resume your normal diet as instructed by your doctor.   Begin with a light meal and progress to your normal diet. Heavy or fried foods are harder to digest and may make you feel sick to your stomach (nauseated).   Avoid alcoholic beverages for 24 hours or as instructed.  MEDICATIONS  You may resume your normal medications unless your doctor tells you otherwise.  WHAT YOU CAN EXPECT TODAY  Some feelings of bloating in the abdomen.   Passage of more gas than usual.   Spotting of blood in your stool or on the toilet paper.  IF YOU HAD POLYPS REMOVED DURING THE COLONOSCOPY:  No aspirin products for 7 days or as instructed.   No alcohol for 7 days or as instructed.   Eat a soft diet for the next 24 hours.  FINDING OUT THE RESULTS OF YOUR TEST Not all test results are available during your visit. If your test results are not back during the visit, make an appointment  with your caregiver to find out the results. Do not assume everything is normal if you have not heard from your caregiver or the medical facility. It is important for you to follow up on all of your test results.  SEEK IMMEDIATE MEDICAL ATTENTION IF:  You have more than a spotting of blood in your stool.   Your belly is swollen (abdominal distention).   You are nauseated or vomiting.   You have a temperature over 101.   You have abdominal pain or discomfort that is severe or gets worse throughout the day.   Colon polyps and diverticulosis information provided  Further recommendations to follow pending review of pathology report     Colonoscopa: cuidados posteriores (Colonoscopy, Care After) Siga estas instrucciones durante las prximas semanas. Estas indicaciones le proporcionan informacin general acerca de cmo deber cuidarse despus del procedimiento. El mdico tambin podr darle instrucciones ms especficas. El tratamiento ha sido planificado segn las prcticas mdicas actuales, pero en algunos casos pueden ocurrir problemas. Comunquese con el mdico si tiene algn problema o tiene dudas despus del procedimiento. QU ESPERAR DESPUS DEL PROCEDIMIENTO  Despus del procedimiento, es comn tener las siguientes sensaciones:  Una pequea cantidad de sangre en la materia fecal.  Cantidades moderadas de gases e hinchazn o calambres abdominales leves. INSTRUCCIONES PARA EL CUIDADO EN EL HOGAR  No conduzca vehculos, opere maquinarias ni firme documentos importantes durante 24horas.  Puede ducharse y retomar sus actividades fsicas habituales, pero  muvase a un ritmo ms lento durante las primeras 24horas.  Tmese descansos frecuentes durante las primeras 24horas.  Camine o colquese compresas calientes en el abdomen para ayudar a reducir los calambres e hinchazn abdominales.  Beba suficiente lquido para Consulting civil engineer orina clara o de color amarillo plido.  Puede  retomar su dieta normal segn las instrucciones de su mdico. Evite los alimentos pesados o fritos que son difciles de [employer].  Evite consumir alcohol durante 24horas o segn las instrucciones de su mdico.  Tome solo medicamentos de venta libre o recetados, segn las indicaciones del mdico.  Si se obtuvo una muestra de tejido (biopsia) durante el procedimiento:  No tome aspirina ni anticoagulantes durante 7das, o segn las instrucciones de su mdico.  No consuma alcohol durante 7das o segn las instrucciones de su mdico.  Consuma alimentos livianos durante las primeras 24horas. SOLICITE ATENCIN MDICA SI: Tiene manchas persistentes de sangre en la materia fecal entre 2 y 3das posteriores al procedimiento. SOLICITE ATENCIN MDICA DE INMEDIATO SI:  Tiene ms que una pequea mancha de sangre en la materia fecal.  Elimina grandes cogulos de sangre en la materia fecal.  Tiene el abdomen hinchado (distendido).  Tiene nuseas o vmitos.  Tiene fiebre.  Siente dolor intenso en el abdomen que no se alivia con los [employer].   Esta informacin no tiene Marine scientist el consejo del mdico. Asegrese de hacerle al mdico cualquier pregunta que tenga.   Document Released: 02/22/2009 Document Revised: 09/16/2013 Elsevier Interactive Patient Education [employer].   Diverticulosis (Diverticulosis) La diverticulosis es una enfermedad que aparece cuando se forman pequeos bolsillos (divertculos) en las paredes del colon. El colon, o intestino grueso, es el lugar donde se absorbe agua y se forman las heces. Los bolsillos se forman cuando la capa interna del colon ejerce presin sobre los puntos dbiles de las capas externas. CAUSAS  Nadie sabe con exactitud qu causa la diverticulosis. FACTORES DE RIESGO  Ser mayor de 27aos. El riesgo de desarrollar esta enfermedad aumenta con la edad. La diverticulosis es poco frecuente en las personas menores de [place]. A  los 80aos, casi todas las personas tienen la enfermedad.  Comer una dieta con bajo contenido de [place].  Estar estreido con frecuencia.  Tener sobrepeso.  No hacer suficiente ejercicio fsico.  Fumar.  Tomar analgsicos de venta libre, como aspirina e ibuprofeno. SNTOMAS  La mayora de las personas que tienen diverticulosis no presentan sntomas. DIAGNSTICO  Dado que la diverticulosis no suele causar sntomas, los mdicos a menudo descubren la enfermedad durante un examen de otros problemas de colon. En muchos casos, el mdico diagnosticar la diverticulosis mientras utiliza un endoscopio flexible para examinar el colon (colonoscopa). TRATAMIENTO  Si nunca tuvo una infeccin relacionada con la diverticulosis, es posible que no necesite tratamiento. Si ha tenido una infeccin antes, el tratamiento puede incluir:  Comer ms frutas, verduras y cereales.  Tomar un suplemento de [place].  Tomar un suplemento de bacterias vivas (probitico).  Tomar medicamentos para relajar el colon. INSTRUCCIONES PARA EL CUIDADO EN EL HOGAR   Beba por lo menos entre 6 y 8vasos de agua por da para Engineer, civil (consulting).  Trate de no hacer fuerza al mover el intestino.  Cumpla con todas las visitas de control. Si ha tenido una infeccin antes:   Aumente la cantidad de fibra en la dieta, segn las indicaciones del mdico o del nutricionista.  Tome un suplemento dietario con fibras si el mdico lo autoriza.  [employer]  medicamentos solamente como se lo haya indicado el mdico. SOLICITE ATENCIN MDICA SI:   Siente dolor abdominal.  Tiene meteorismo.  Tiene clicos.  No ha defecado en 3das. SOLICITE ATENCIN MDICA DE INMEDIATO SI:   El dolor empeora.  El meteorismo [employer].  Tiene fiebre o escalofros, y los sntomas empeoran repentinamente.  Comienza a vomitar.  La materia fecal es sanguinolenta o negra. ASEGRESE DE QUE:  Comprende estas  instrucciones.  Controlar su afeccin.  Recibir ayuda de inmediato si no mejora o si empeora.   Esta informacin no tiene Marine scientist el consejo del mdico. Asegrese de hacerle al mdico cualquier pregunta que tenga.   Document Released: 11/08/2008 Document Revised: 12/01/2013 Elsevier Interactive Patient Education 2016 [place] en el colon  (Colon Polyps) Los plipos son masas de tejido que crecen dentro del cuerpo. Los plipos pueden desarrollarse en el intestino grueso (colon). La mayora de los plipos son no cancerosos (benignos). Sin embargo, algunos plipos pueden convertirse en cancerosos con el tiempo. Los plipos que sean ms grandes que un guisante pueden ser [employer]. Para estar seguros, los mdicos extirpan y [employer] plipos.  CAUSAS  Se forman cuando ciertas mutaciones genticas hacen que las clulas se desarrollen y se dividan por dems.  FACTORES DE RIESGO  Hay un nmero de factores de riesgo que pueden aumentar las probabilidades de padecer plipos en el colon. Ellos son:  Ser mayor de 29 aos de edad. Historia familiar de cncer o plipos de colon. Ciertas enfermedades crnicas como la colitis o la enfermedad de Crohn. Tener sobrepeso. El hbito de fumar. El sedentarismo. Beber alcohol en exceso. SNTOMAS  La mayor parte de los plipos no causa sntomas. Si se presentan sntomas, stos pueden ser:  [employer] materia fecal. Heces de color rojo oscuro o negro. Constipacin o diarrea que duran ms de 1 semana. DIAGNSTICO  [place] persona con frecuencia no sabe que tiene plipos [employer] su mdico los halla durante un examen fsico de [place]. El mdico puede usar 4 tipos de pruebas para Hydrographic surveyor plipos:  Examen Musician. Se colocar guantes y palpar el interior del recto. Esta prueba detectar solo plipos en el recto. Enema de bario. El mdico introduce un lquido llamado bario en el recto y luego toma radiografas del  colon. El bario hace que el colon se vea blanco. Los plipos son de color oscuro, por lo que son fciles de Chiropodist. Sigmoideoscopia. Se coloca un tubo delgado y flexible (sigmoideoscopio) en el recto. Este sigmoideoscopio tiene [place] fuente de luz y Ardelia Mems pequea cmara de video. El mdico [place] el sigmoideoscopio para observar el ltimo tercio del colon. Colonoscopa. Esta prueba es similar a la sigmoideoscopia, pero el mdico examina todo el colon. Este es el mtodo ms frecuente para Hydrographic surveyor y extirpar los plipos. TRATAMIENTO  Todo plipo ser extirpado Daneil Dan sigmoideoscopa o una colonoscopa. Luego se analizan para Heritage manager.  PREVENCIN  Para disminuir los riesgos de volver a Best boy plipos en el colon:  Coma mucha fruta y [place]. Evite las comidas grasas. No fume. Evite consumir alcohol. [place]. Baje de peso segn las indicaciones de su mdico. Consuma mucho clcio y folato. Las comidas que contienen calcio son la [place], los quesos y el brcoli. Las comidas que contienen folato son los garbanzos, los frijoles rojos y Nurse, mental health. INSTRUCCIONES PARA EL CUIDADO EN EL HOGAR  Cumpla con todas las visitas de control, segn le  indique su mdico. Podr necesitar exmenes peridicos para controlar si vuelven a aparecer.  SOLICITE ATENCIN MDICA SI:  Nota sangrado al mover el intestino.    Esta informacin no tiene Marine scientist el consejo del mdico. Asegrese de hacerle al mdico cualquier pregunta que tenga.   Document Released: 05/27/2012 Elsevier Interactive Patient Education [employer].

## 2016-03-08 NOTE — H&P (Signed)
@LOGO @   Primary Care Physician:  No PCP Per Patient Primary Gastroenterologist:  Dr. Gala Romney  Pre-Procedure History & Physical: HPI:  Martin Cortez is a 75 y.o. male is here for a screening colonoscopy. No bowel symptoms. No family history of colon cancer. Last colonoscopy 7-8 years ago elsewhere reportedly negative.  Patient states no history of sleep apnea in contradistinction  to what is in his medical record.  Past Medical History  Diagnosis Date  . Arthritis   . Bladder stone   . Sleep apnea     STOP BANG score= 5    Past Surgical History  Procedure Laterality Date  . Cystoscopy  2012    [facility], [facility]  . Umbilical hernia repair  01/08/11    incarcerated, [facility] Baseman, [facility]  . Cystoscopy with litholapaxy  06/07/09    [facility], [facility], spinal  . Cystoscopy with litholapaxy  02/21/2012    Procedure: CYSTOSCOPY WITH LITHOLAPAXY;  Surgeon: Marissa Nestle, MD;  Location: [facility];  Service: Urology;  Laterality: N/A;    Prior to Admission medications   Medication Sig Start Date End Date Taking? Authorizing Provider  albuterol (PROVENTIL HFA;VENTOLIN HFA) 108 (90 Base) MCG/ACT inhaler Inhale 1-2 puffs into the lungs every 6 (six) hours as needed for wheezing or shortness of breath.   Yes Historical Provider, MD  Fluticasone-Salmeterol (ADVAIR) 250-50 MCG/DOSE AEPB Inhale 1 puff into the lungs 2 (two) times daily.   Yes Historical Provider, MD  ibuprofen (ADVIL,MOTRIN) 200 MG tablet Take 400 mg by mouth daily as needed (neck pain).   Yes Historical Provider, MD  polyethylene glycol-electrolytes (TRILYTE) 420 g solution Take 4,000 mLs by mouth as directed. 01/11/16  Yes Daneil Dolin, MD  simvastatin (ZOCOR) 40 MG tablet Take 40 mg by mouth daily.   Yes Historical Provider, MD  triamcinolone cream (KENALOG) 0.1 % Apply 1 application topically 2 (two) times daily as needed (itching to legs).   Yes Historical Provider, MD  triamterene-hydrochlorothiazide (MAXZIDE-25) 37.5-25 MG tablet  Take 1 tablet by mouth daily.   Yes Historical Provider, MD    Allergies as of 02/13/2016  . (No Known Allergies)    No family history on file.  Social History   Social History  . Marital Status: Married    Spouse Name: N/A  . Number of Children: N/A  . Years of Education: N/A   Occupational History  . Not on file.   Social History Main Topics  . Smoking status: Never Smoker   . Smokeless tobacco: Not on file  . Alcohol Use: Yes  . Drug Use: No  . Sexual Activity: Not on file   Other Topics Concern  . Not on file   Social History Narrative    Review of Systems: See HPI, otherwise negative ROS  Physical Exam: BP 136/92 mmHg  Pulse 83  Temp(Src) 97.7 F (36.5 C) (Oral)  Resp 10  Ht 5\' 11"  (1.803 m)  Wt 250 lb (113.399 kg)  BMI 34.88 kg/m2  SpO2 98% General:   Alert,  Well-developed, well-nourished, pleasant and cooperative in NAD Head:  Normocephalic and atraumatic. Mouth:  No deformity or lesions, dentition normal. Neck:  Supple; no masses or thyromegaly. Lungs:  Clear throughout to auscultation.   No wheezes, crackles, or rhonchi. No acute distress. Heart:  Regular rate and rhythm; no murmurs, clicks, rubs,  or gallops. Abdomen:  Soft, nontender and nondistended. No masses, hepatosplenomegaly or hernias noted. Normal bowel sounds, without guarding, and without rebound.   Msk:  Symmetrical  without gross deformities. Normal posture. Pulses:  Normal pulses noted. Extremities:  Without clubbing or edema.  Impression/Plan: Martin Cortez is now here to undergo a screening colonoscopy.  Average risk screening examination.  Risks, benefits, limitations, imponderables and alternatives regarding colonoscopy have been reviewed with the patient. Questions have been answered. All parties agreeable.   Communication today took place via a [facility] interpreter.  Notice:  This dictation was prepared with Dragon dictation along with smaller phrase  technology. Any transcriptional errors that result from this process are unintentional and may not be corrected upon review.

## 2016-03-08 NOTE — Op Note (Signed)
[facility] Patient Name: Martin Cortez Procedure Date: 03/08/2016 9:17 AM MRN: [id number] Date of Birth: 01/11/1941 Attending MD: Norvel Richards , MD CSN: [id number] Age: 75 Admit Type: Outpatient Procedure:                Colonoscopy Indications:              Screening for colorectal malignant neoplasm;                            average risk Providers:                Norvel Richards, MD, Gwenlyn Fudge, RN, Georgeann Oppenheim, Technician Referring MD:             Dr.Zheng Medicines:                Midazolam 5 mg IV, Meperidine 75 mg IV, Ondansetron                            4 mg IV Complications:            No immediate complications. Estimated Blood Loss:     Estimated blood loss was minimal. Estimated blood                            loss was minimal. Procedure:                Pre-Anesthesia Assessment:                           - Prior to the procedure, a History and Physical                            was performed, and patient medications and                            allergies were reviewed. The patient's tolerance of                            previous anesthesia was also reviewed. The risks                            and benefits of the procedure and the sedation                            options and risks were discussed with the patient.                            All questions were answered, and informed consent                            was obtained. Prior Anticoagulants: The patient has                            taken no  previous anticoagulant or antiplatelet                            agents. ASA Grade Assessment: II - A patient with                            mild systemic disease. After reviewing the risks                            and benefits, the patient was deemed in                            satisfactory condition to undergo the procedure.                           After obtaining informed consent, the  colonoscope                            was passed under direct vision. Throughout the                            procedure, the patient's blood pressure, pulse, and                            oxygen saturations were monitored continuously. The                            [id number]) scope was introduced through                            the anus and advanced to the the cecum, identified                            by appendiceal orifice and ileocecal valve. The                            colonoscopy was performed without difficulty. The                            patient tolerated the procedure well. The quality                            of the bowel preparation was adequate. The                            ileocecal valve, appendiceal orifice, and rectum                            were photographed. Scope In: 9:48:26 AM Scope Out: 10:04:07 AM Scope Withdrawal Time: 0 hours 12 minutes 59 seconds  Total Procedure Duration: 0 hours 15 minutes 41 seconds  Findings:      Three semi-pedunculated polyps were found (2)in the descending colon and       (1) in the ascending colon.. The polyps were 4  to 5 mm in size. These       polyps were removed with a cold snare. Resection and retrieval were       complete. Estimated blood loss was minimal.      The exam was otherwise without abnormality.      No additional abnormalities were found on retroflexion.      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 Impression:               - Three 4 to 5 mm polyps in the descending and                            ascending colon, removed with a cold snare.                            Resected and retrieved.                           - The examination was otherwise normal. Moderate Sedation:      Moderate (conscious) sedation was administered by the endoscopy nurse       and supervised by the endoscopist. The following parameters were       monitored: oxygen saturation, heart  rate, blood pressure, respiratory       rate, EKG, adequacy of pulmonary ventilation, and response to care.       Total physician intraservice time was 24 minutes. Recommendation:           - Patient has a contact number available for                            emergencies. The signs and symptoms of potential                            delayed complications were discussed with the                            patient. Return to normal activities tomorrow.                            Written discharge instructions were provided to the                            patient.                           - Resume regular diet today.                           - Continue present medications.                           - Await pathology results.                           - Repeat colonoscopy date to be determined after  pending pathology results are reviewed for                            surveillance based on pathology results.                           - Return to GI office at appointment to be                            scheduled. Procedure Code(s):        --- Professional ---                           [phone], Colonoscopy, flexible; with removal of                            tumor(s), polyp(s), or other lesion(s) by snare                            technique                           [id number], Moderate sedation services provided by the                            same physician or other qualified health care                            professional performing the diagnostic or                            therapeutic service that the sedation supports,                            requiring the presence of an independent trained                            observer to assist in the monitoring of the                            patient's level of consciousness and physiological                            status; initial 15 minutes of intraservice time,                            patient  age 59 years or older                           [phone], Moderate sedation services; each additional                            15 minutes intraservice time Diagnosis Code(s):        --- Professional ---  Z12.11, Encounter for screening for malignant                            neoplasm of colon                           D12.4, Benign neoplasm of descending colon CPT copyright 2016 [employer]. All rights reserved. The codes documented in this report are preliminary and upon coder review may  be revised to meet current compliance requirements. Cristopher Estimable. Sheketa Ende, MD Norvel Richards, MD 03/08/2016 10:15:48 AM This report has been signed electronically. Number of Addenda: 0

## 2016-03-11 ENCOUNTER — Encounter: Payer: Self-pay | Admitting: Internal Medicine

## 2016-03-12 ENCOUNTER — Encounter (HOSPITAL_COMMUNITY): Payer: Self-pay | Admitting: Internal Medicine

## 2016-06-29 ENCOUNTER — Emergency Department (HOSPITAL_COMMUNITY): Payer: Medicare Other

## 2016-06-29 ENCOUNTER — Other Ambulatory Visit: Payer: Self-pay

## 2016-06-29 ENCOUNTER — Inpatient Hospital Stay (HOSPITAL_COMMUNITY)
Admission: EM | Admit: 2016-06-29 | Discharge: 2016-07-03 | DRG: 200 | Disposition: A | Discharge status: Home Health | Payer: Medicare Other | Attending: General Surgery | Admitting: General Surgery

## 2016-06-29 ENCOUNTER — Encounter (HOSPITAL_COMMUNITY): Payer: Self-pay | Admitting: Emergency Medicine

## 2016-06-29 DIAGNOSIS — R062 Wheezing: Secondary | ICD-10-CM

## 2016-06-29 DIAGNOSIS — N138 Other obstructive and reflux uropathy: Secondary | ICD-10-CM | POA: Diagnosis not present

## 2016-06-29 DIAGNOSIS — S2232XA Fracture of one rib, left side, initial encounter for closed fracture: Secondary | ICD-10-CM | POA: Diagnosis present

## 2016-06-29 DIAGNOSIS — Z23 Encounter for immunization: Secondary | ICD-10-CM

## 2016-06-29 DIAGNOSIS — R58 Hemorrhage, not elsewhere classified: Secondary | ICD-10-CM

## 2016-06-29 DIAGNOSIS — G4730 Sleep apnea, unspecified: Secondary | ICD-10-CM | POA: Diagnosis not present

## 2016-06-29 DIAGNOSIS — J45909 Unspecified asthma, uncomplicated: Secondary | ICD-10-CM | POA: Diagnosis present

## 2016-06-29 DIAGNOSIS — N401 Enlarged prostate with lower urinary tract symptoms: Secondary | ICD-10-CM | POA: Diagnosis present

## 2016-06-29 DIAGNOSIS — S271XXA Traumatic hemothorax, initial encounter: Principal | ICD-10-CM | POA: Diagnosis present

## 2016-06-29 DIAGNOSIS — R590 Localized enlarged lymph nodes: Secondary | ICD-10-CM | POA: Diagnosis not present

## 2016-06-29 DIAGNOSIS — M1990 Unspecified osteoarthritis, unspecified site: Secondary | ICD-10-CM | POA: Diagnosis present

## 2016-06-29 DIAGNOSIS — Z87891 Personal history of nicotine dependence: Secondary | ICD-10-CM

## 2016-06-29 DIAGNOSIS — J942 Hemothorax: Secondary | ICD-10-CM | POA: Diagnosis present

## 2016-06-29 DIAGNOSIS — M954 Acquired deformity of chest and rib: Secondary | ICD-10-CM | POA: Diagnosis not present

## 2016-06-29 DIAGNOSIS — R079 Chest pain, unspecified: Secondary | ICD-10-CM | POA: Diagnosis present

## 2016-06-29 LAB — I-STAT CG4 LACTIC ACID, ED: LACTIC ACID, VENOUS: 2.36 mmol/L — AB (ref 0.5–1.9)

## 2016-06-29 LAB — CBC
HEMATOCRIT: 34.6 % — AB (ref 39.0–52.0)
HEMOGLOBIN: 11 g/dL — AB (ref 13.0–17.0)
MCH: 29.6 pg (ref 26.0–34.0)
MCHC: 31.8 g/dL (ref 30.0–36.0)
MCV: 93 fL (ref 78.0–100.0)
Platelets: 288 10*3/uL (ref 150–400)
RBC: 3.72 MIL/uL — ABNORMAL LOW (ref 4.22–5.81)
RDW: 17.4 % — ABNORMAL HIGH (ref 11.5–15.5)
WBC: 14.2 10*3/uL — ABNORMAL HIGH (ref 4.0–10.5)

## 2016-06-29 LAB — URINALYSIS, ROUTINE W REFLEX MICROSCOPIC
Bilirubin Urine: NEGATIVE
Glucose, UA: NEGATIVE mg/dL
Ketones, ur: NEGATIVE mg/dL
LEUKOCYTES UA: NEGATIVE
NITRITE: NEGATIVE
PH: 5.5 (ref 5.0–8.0)
Protein, ur: 100 mg/dL — AB
SPECIFIC GRAVITY, URINE: 1.025 (ref 1.005–1.030)

## 2016-06-29 LAB — HEPATIC FUNCTION PANEL
ALBUMIN: 2.8 g/dL — AB (ref 3.5–5.0)
ALK PHOS: 91 U/L (ref 38–126)
ALT: 17 U/L (ref 17–63)
AST: 22 U/L (ref 15–41)
BILIRUBIN INDIRECT: 0.6 mg/dL (ref 0.3–0.9)
Bilirubin, Direct: 0.2 mg/dL (ref 0.1–0.5)
TOTAL PROTEIN: 7.3 g/dL (ref 6.5–8.1)
Total Bilirubin: 0.8 mg/dL (ref 0.3–1.2)

## 2016-06-29 LAB — URINE MICROSCOPIC-ADD ON

## 2016-06-29 LAB — BASIC METABOLIC PANEL
ANION GAP: 5 (ref 5–15)
BUN: 18 mg/dL (ref 6–20)
CO2: 25 mmol/L (ref 22–32)
Calcium: 8.3 mg/dL — ABNORMAL LOW (ref 8.9–10.3)
Chloride: 107 mmol/L (ref 101–111)
Creatinine, Ser: 1.37 mg/dL — ABNORMAL HIGH (ref 0.61–1.24)
GFR calc Af Amer: 57 mL/min — ABNORMAL LOW (ref >60)
GFR, EST NON AFRICAN AMERICAN: 49 mL/min — AB (ref >60)
GLUCOSE: 103 mg/dL — AB (ref 65–99)
POTASSIUM: 4.2 mmol/L (ref 3.5–5.1)
Sodium: 137 mmol/L (ref 135–145)

## 2016-06-29 LAB — PROTIME-INR
INR: 1.07 (ref 0.00–1.49)
Prothrombin Time: 14.1 seconds (ref 11.6–15.2)

## 2016-06-29 LAB — TROPONIN I: Troponin I: <0.03 ng/mL (ref <0.03)

## 2016-06-29 LAB — SAMPLE TO BLOOD BANK

## 2016-06-29 MED ORDER — SODIUM CHLORIDE 0.9 % IV BOLUS (SEPSIS)
500.0000 mL | Freq: Once | INTRAVENOUS | Status: AC
  Administered 2016-06-29: 500 mL via INTRAVENOUS

## 2016-06-29 MED ORDER — MORPHINE SULFATE (PF) 4 MG/ML IV SOLN: 4.0000 mg | INTRAVENOUS | Status: DC | PRN

## 2016-06-29 MED ORDER — MORPHINE SULFATE (PF) 2 MG/ML IV SOLN
2.0000 mg | INTRAVENOUS | Status: AC | PRN
  Administered 2016-06-29 – 2016-06-30 (×2): 2 mg via INTRAVENOUS
  Filled 2016-06-29 (×2): qty 1

## 2016-06-29 MED ORDER — IOPAMIDOL (ISOVUE-370) INJECTION 76%
100.0000 mL | Freq: Once | INTRAVENOUS | Status: AC | PRN
  Administered 2016-06-29: 100 mL via INTRAVENOUS

## 2016-06-29 MED ORDER — ONDANSETRON HCL 4 MG/2ML IJ SOLN: 4.0000 mg | Freq: Three times a day (TID) | INTRAMUSCULAR | Status: DC | PRN

## 2016-06-29 MED ORDER — SODIUM CHLORIDE 0.9 % IV SOLN
INTRAVENOUS | Status: AC
  Administered 2016-06-30: via INTRAVENOUS

## 2016-06-29 MED ORDER — HYDROMORPHONE HCL 1 MG/ML IJ SOLN: 0.2500 mg | INTRAMUSCULAR | Status: DC | PRN

## 2016-06-29 MED ORDER — SODIUM CHLORIDE 0.9 % IV SOLN
INTRAVENOUS | Status: DC
  Administered 2016-06-29 – 2016-06-30 (×3): via INTRAVENOUS

## 2016-06-29 NOTE — ED Provider Notes (Signed)
CSN: [id number]     Arrival date & time 06/29/16  1643 History   First MD Initiated Contact with Patient 06/29/16 1808     Chief Complaint  Patient presents with  . Chest Pain      HPI Pt was seen at 1810. Per pt and his family, c/o gradual onset and persistence of intermittent left sided chest "pain" and SOB for the past 1 month. Has been associated with "a lump" on the left side of his chest. Pt was evaluated by his PMD, then sent to the ED for further evaluation. Denies injury, no rash, no fevers, no abd pain, no N/V/D, no cough, no palpitations.    Past Medical History  Diagnosis Date  . Arthritis   . Bladder stone   . Sleep apnea     STOP BANG score= 5   Past Surgical History  Procedure Laterality Date  . Cystoscopy  2012    [facility], [facility]  . Umbilical hernia repair  01/08/11    incarcerated, [facility] Bas[facility], [facility]  . Cystoscopy with litholapaxy  06/07/09    [facility], [facility], spinal  . Cystoscopy with litholapaxy  02/21/2012    Procedure: CYSTOSCOPY WITH LITHOLAPAXY;  Surgeon: Marissa Nestle, MD;  Location: [facility];  Service: Urology;  Laterality: N/A;  . Colonoscopy N/A 03/08/2016    Procedure: COLONOSCOPY;  Surgeon: Daneil Dolin, MD;  Location: [facility];  Service: Endoscopy;  Laterality: N/A;  9:30 AM - interpreter scheduled - do NOT move    Social History  Substance Use Topics  . Smoking status: Former Research scientist (life sciences)  . Smokeless tobacco: None  . Alcohol Use: No    Review of Systems ROS: Statement: All systems negative except as marked or noted in the HPI; Constitutional: Negative for fever and chills. ; ; Eyes: Negative for eye pain, redness and discharge. ; ; ENMT: Negative for ear pain, hoarseness, nasal congestion, sinus pressure and sore throat. ; ; Cardiovascular: Negative for palpitations, diaphoresis, and peripheral edema. ; ; Respiratory: +SOB. Negative for cough, wheezing and stridor. ; ; Gastrointestinal: Negative for nausea, vomiting, diarrhea, abdominal pain, blood in stool,  hematemesis, jaundice and rectal bleeding. . ; ; Genitourinary: Negative for dysuria, flank pain and hematuria. ; ; Musculoskeletal: +chest wall pain. Negative for back pain and neck pain. Negative for swelling and trauma.; ; Skin: Negative for pruritus, rash, abrasions, blisters, and skin lesion.; ; Neuro: Negative for headache, lightheadedness and neck stiffness. Negative for weakness, altered level of consciousness, altered mental status, extremity weakness, paresthesias, involuntary movement, seizure and syncope.      Allergies  Review of patient's allergies indicates no known allergies.  Home Medications   Prior to Admission medications   Medication Sig Start Date End Date Taking? Authorizing Provider  albuterol (PROVENTIL HFA;VENTOLIN HFA) 108 (90 Base) MCG/ACT inhaler Inhale 1-2 puffs into the lungs every 6 (six) hours as needed for wheezing or shortness of breath.   Yes Historical Provider, MD  Fluticasone-Salmeterol (ADVAIR) 250-50 MCG/DOSE AEPB Inhale 1 puff into the lungs 2 (two) times daily.    Historical Provider, MD   BP 111/71 mmHg  Pulse 113  Temp(Src) 98.5 F (36.9 C) (Oral)  Resp 24  Wt 170 lb (77.111 kg)  SpO2 96%   Patient Vitals for the past 24 hrs:  BP Temp Temp src Pulse Resp SpO2 Weight  06/29/16 2030 115/67 mmHg - - 91 20 96 % -  06/29/16 2000 124/73 mmHg - - 97 18 97 % -  06/29/16 1945 - - -  92 19 95 % -  06/29/16 1930 107/71 mmHg - - 96 20 95 % -  06/29/16 1900 108/71 mmHg - - 97 21 95 % -  06/29/16 1830 127/71 mmHg - - 109 19 97 % -  06/29/16 1800 114/66 mmHg - - 113 22 94 % -  06/29/16 1730 109/71 mmHg - - 111 26 95 % -  06/29/16 1723 - - - 113 24 96 % -  06/29/16 1720 111/71 mmHg - - 111 26 95 % -  06/29/16 1649 - 98.5 F (36.9 C) Oral 113 16 94 % 170 lb (77.111 kg)     Physical Exam  1815: Physical examination:  Nursing notes reviewed; Vital signs and O2 SAT reviewed;  Constitutional: Well developed, Well nourished, Well hydrated, In no acute  distress; Head:  Normocephalic, atraumatic; Eyes: EOMI, PERRL, No scleral icterus; ENMT: Mouth and pharynx normal, Mucous membranes moist; Neck: Supple, Full range of motion, No lymphadenopathy; Cardiovascular: Tachycardic rate and rhythm, No gallop; Respiratory: Breath sounds coarse & equal bilaterally, No wheezes.  Speaking full sentences with ease, Normal respiratory effort/excursion; Chest: +left lower anterior-lateral ribs tender to palp, +crepitus. No open wounds. +ecchymosis left upper chest wall and trapezius muscle area.  Movement normal; Abdomen: Soft, Nontender, Nondistended, Normal bowel sounds; Genitourinary: No CVA tenderness; Spine:  No midline CS, TS, LS tenderness. +ecchymosis from left posterior shoulder to flank.;; Extremities: Pulses normal, No tenderness, No edema, No calf edema or asymmetry.; Neuro: AA&Ox3, Major CN grossly intact.  Speech clear. No gross focal motor or sensory deficits in extremities.; Skin: Color normal, Warm, Dry.   ED Course  Procedures (including critical care time) Labs Review  Imaging Review  I have personally reviewed and evaluated these images and lab results as part of my medical decision-making.   EKG Interpretation   Date/Time:  Friday June 29 2016 16:45:35 EDT Ventricular Rate:  114 PR Interval:  130 QRS Duration: 88 QT Interval:  340 QTC Calculation: 468 R Axis:   22 Text Interpretation:  Sinus tachycardia Cannot rule out Anterior infarct ,  age undetermined When compared with ECG of 02/20/2012 Rate faster Confirmed  by [facility]  MD, Nunzio Cory [phone]) on 06/29/2016 6:15:55 PM      MDM  MDM Reviewed: previous chart, nursing note and vitals Reviewed previous: labs and ECG Interpretation: labs, ECG, x-ray and CT scan     Results for orders placed or performed during the hospital encounter of [id number]  Basic metabolic panel  Result Value Ref Range   Sodium 137 135 - 145 mmol/L   Potassium 4.2 3.5 - 5.1 mmol/L   Chloride 107 101 -  111 mmol/L   CO2 25 22 - 32 mmol/L   Glucose, Bld 103 (H) 65 - 99 mg/dL   BUN 18 6 - 20 mg/dL   Creatinine, Ser 1.37 (H) 0.61 - 1.24 mg/dL   Calcium 8.3 (L) 8.9 - 10.3 mg/dL   GFR calc non Af Amer 49 (L) >60 mL/min   GFR calc Af Amer 57 (L) >60 mL/min   Anion gap 5 5 - 15  CBC  Result Value Ref Range   WBC 14.2 (H) 4.0 - 10.5 K/uL   RBC 3.72 (L) 4.22 - 5.81 MIL/uL   Hemoglobin 11.0 (L) 13.0 - 17.0 g/dL   HCT 34.6 (L) 39.0 - 52.0 %   MCV 93.0 78.0 - 100.0 fL   MCH 29.6 26.0 - 34.0 pg   MCHC 31.8 30.0 - 36.0 g/dL   RDW 17.4 (  H) 11.5 - 15.5 %   Platelets 288 150 - 400 K/uL  Troponin I  Result Value Ref Range   Troponin I <0.03 <0.03 ng/mL  Hepatic function panel  Result Value Ref Range   Total Protein 7.3 6.5 - 8.1 g/dL   Albumin 2.8 (L) 3.5 - 5.0 g/dL   AST 22 15 - 41 U/L   ALT 17 17 - 63 U/L   Alkaline Phosphatase 91 38 - 126 U/L   Total Bilirubin 0.8 0.3 - 1.2 mg/dL   Bilirubin, Direct 0.2 0.1 - 0.5 mg/dL   Indirect Bilirubin 0.6 0.3 - 0.9 mg/dL  Protime-INR  Result Value Ref Range   Prothrombin Time 14.1 11.6 - 15.2 seconds   INR 1.07 0.00 - 1.49  Urinalysis, Routine w reflex microscopic  Result Value Ref Range   Color, Urine YELLOW YELLOW   APPearance CLEAR CLEAR   Specific Gravity, Urine 1.025 1.005 - 1.030   pH 5.5 5.0 - 8.0   Glucose, UA NEGATIVE NEGATIVE mg/dL   Hgb urine dipstick TRACE (A) NEGATIVE   Bilirubin Urine NEGATIVE NEGATIVE   Ketones, ur NEGATIVE NEGATIVE mg/dL   Protein, ur 100 (A) NEGATIVE mg/dL   Nitrite NEGATIVE NEGATIVE   Leukocytes, UA NEGATIVE NEGATIVE  Urine microscopic-add on  Result Value Ref Range   Squamous Epithelial / LPF 0-5 (A) NONE SEEN   WBC, UA 0-5 0 - 5 WBC/hpf   RBC / HPF 0-5 0 - 5 RBC/hpf   Bacteria, UA RARE (A) NONE SEEN  I-Stat CG4 Lactic Acid, ED  Result Value Ref Range   Lactic Acid, Venous 2.36 (HH) 0.5 - 1.9 mmol/L   Comment NOTIFIED PHYSICIAN    Dg Chest 2 View 06/29/2016  CLINICAL DATA:  Chest pain and  shortness of breath. EXAM: CHEST  2 VIEW COMPARISON:  None. FINDINGS: Heart size is normal. Overall cardiomediastinal silhouette is normal in size and configuration. There is a dense opacity at the left lung base which appears to be confined to the lingula, most likely pneumonia. Right lung is clear. No pneumothorax seen. There is a displaced fracture of the left posterior-lateral seventh rib, of uncertain age. Mild degenerative spurring noted within the thoracic spine. IMPRESSION: 1. Dense opacity at the left lung base laterally, confined to the lingula based on the lateral projection, compatible with pneumonia or airspace collapse. Questionable small associated pleural effusion. 2. Significantly displaced fracture of the left posterior-lateral seventh rib, of uncertain age. Electronically Signed   By: Franki Cabot M.D.   On: 06/29/2016 17:46   Ct Angio Chest Pe W/cm &/or Wo Cm 06/29/2016  CLINICAL DATA:  Chest pain, shortness of breath, and flank bruising. EXAM: CT ANGIOGRAPHY CHEST CT ABDOMEN AND PELVIS WITH CONTRAST TECHNIQUE: Multidetector CT imaging of the chest was performed using the standard protocol during bolus administration of intravenous contrast. Multiplanar CT image reconstructions and MIPs were obtained to evaluate the vascular anatomy. Multidetector CT imaging of the abdomen and pelvis was performed using the standard protocol during bolus administration of intravenous contrast. CONTRAST:  100 cc Isovue 370 intravenous COMPARISON:  None. FINDINGS: CTA CHEST FINDINGS Cardiovascular: There is intermittent respiratory motion with pulmonary artery blurring. No acute pulmonary embolism is seen, especially limited on the right beyond the segmental level. No evidence of acute aortic syndrome. Normal heart size without pericardial effusion. Mild scattered atheromatous calcification of the aorta. Mediastinum: Mediastinal and bilateral hilar adenopathy. Right lower peritracheal lymph node measures 17 mm  short axis. No associated cavitation or  calcification. Lungs/Pleura: There is a small left pleural effusion with incomplete layering and occasional pleural thickening and enhancement. This is presumably a hemothorax related to displaced left seventh segmental rib fracture with the lateral component 100% displaced. At the neck, there is subtle signs of healing suggesting subacute injury. This correlates with history of symptoms for 1 month. Lingular opacity with volume loss, likely related atelectasis. Diffuse airway thickening. There is a 8 by 5 mm (7 mm mean diameter) nodule in the right lower lobe 7:76 which could be bronchial lymph node or pulmonary nodule. The same differential for a rounded 7 mm right upper lobe nodule 7:3. Mild centrilobular emphysema. Upper abdomen: See below Musculoskeletal: As above there is segmental fracture of the left seventh rib with displacement. There is edematous change around the left chest wall which is presumably traumatic. A discrete fluid collection deep to the lower latissimus Dorsey measures up to 44 mm and has a small focus of mural calcification, unexpected. CT ABDOMEN and PELVIS FINDINGS Lower chest and abdominal wall:  Fatty right inguinal hernia. Hepatobiliary: Sub cm low-density foci in the liver are likely incidental cysts.No evidence of biliary obstruction or stone. Pancreas: Unremarkable. Spleen: Unremarkable. Adrenals/Urinary Tract: Negative adrenals. Small appearing kidneys with numerous cysts. No hydronephrosis or stone. Unremarkable bladder. Stomach/Bowel:  No obstruction. No inflammatory changes. Reproductive:Prostate enlargement asymmetric to the left, projecting into the bladder base with heterogeneous central gland enhancement that is nonspecific. Vascular/Lymphatic: No acute vascular abnormality. 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 There are mildly enlarged bilateral external iliac lymph nodes measuring up to 12 mm on  the left. Mild prominence of deep liver drainage lymph nodes, usually incidental and reactive. Haziness of fat at the root of the small bowel mesentery without adenopathy, sclerosing mesenteritis. Other: No ascites or pneumoperitoneum. Musculoskeletal: Advanced lumbar disc and facet degeneration. Rib fracture described above. No acute osseous findings at the abdomen. Review of the MIP images confirms the above findings. IMPRESSION: 1. Small complex left effusion that is presumably hemothorax given segmental left seventh rib fracture that is displaced. Lingular atelectasis. Left chest wall swelling correlates with a presumed hematoma/seroma. 2. Hilar/mediastinal adenopathy and two 7 mm right lung nodules. This appearance could be seen with lymphoma, metastasis, or sarcoidosis. Recommend outpatient workup and chest CT in 3 months. 3. Moderately motion degraded study. No evidence of pulmonary embolism. 4. No acute intra-abdominal finding. 5. Prostatomegaly with asymmetric left-sided enlargement. Correlate with PSA. 6. Mild iliac adenopathy bilaterally. Significance to be determined based on workup of #2. 7.  Emphysema. (ICD10-J43.9), mild. 8.  Aortic Atherosclerosis (ICD10-170.0) Electronically Signed   By: Monte Fantasia M.D.   On: 06/29/2016 22:02   Ct Abdomen Pelvis W Contrast 06/29/2016  CLINICAL DATA:  Chest pain, shortness of breath, and flank bruising. EXAM: CT ANGIOGRAPHY CHEST CT ABDOMEN AND PELVIS WITH CONTRAST TECHNIQUE: Multidetector CT imaging of the chest was performed using the standard protocol during bolus administration of intravenous contrast. Multiplanar CT image reconstructions and MIPs were obtained to evaluate the vascular anatomy. Multidetector CT imaging of the abdomen and pelvis was performed using the standard protocol during bolus administration of intravenous contrast. CONTRAST:  100 cc Isovue 370 intravenous COMPARISON:  None. FINDINGS: CTA CHEST FINDINGS Cardiovascular: There is  intermittent respiratory motion with pulmonary artery blurring. No acute pulmonary embolism is seen, especially limited on the right beyond the segmental level. No evidence of acute aortic syndrome. Normal heart size without pericardial effusion. Mild scattered atheromatous calcification of  the aorta. Mediastinum: Mediastinal and bilateral hilar adenopathy. Right lower peritracheal lymph node measures 17 mm short axis. No associated cavitation or calcification. Lungs/Pleura: There is a small left pleural effusion with incomplete layering and occasional pleural thickening and enhancement. This is presumably a hemothorax related to displaced left seventh segmental rib fracture with the lateral component 100% displaced. At the neck, there is subtle signs of healing suggesting subacute injury. This correlates with history of symptoms for 1 month. Lingular opacity with volume loss, likely related atelectasis. Diffuse airway thickening. There is a 8 by 5 mm (7 mm mean diameter) nodule in the right lower lobe 7:76 which could be bronchial lymph node or pulmonary nodule. The same differential for a rounded 7 mm right upper lobe nodule 7:3. Mild centrilobular emphysema. Upper abdomen: See below Musculoskeletal: As above there is segmental fracture of the left seventh rib with displacement. There is edematous change around the left chest wall which is presumably traumatic. A discrete fluid collection deep to the lower latissimus Dorsey measures up to 44 mm and has a small focus of mural calcification, unexpected. CT ABDOMEN and PELVIS FINDINGS Lower chest and abdominal wall:  Fatty right inguinal hernia. Hepatobiliary: Sub cm low-density foci in the liver are likely incidental cysts.No evidence of biliary obstruction or stone. Pancreas: Unremarkable. Spleen: Unremarkable. Adrenals/Urinary Tract: Negative adrenals. Small appearing kidneys with numerous cysts. No hydronephrosis or stone. Unremarkable bladder. Stomach/Bowel:  No  obstruction. No inflammatory changes. Reproductive:Prostate enlargement asymmetric to the left, projecting into the bladder base with heterogeneous central gland enhancement that is nonspecific. Vascular/Lymphatic: No acute vascular abnormality. 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 There are mildly enlarged bilateral external iliac lymph nodes measuring up to 12 mm on the left. Mild prominence of deep liver drainage lymph nodes, usually incidental and reactive. Haziness of fat at the root of the small bowel mesentery without adenopathy, sclerosing mesenteritis. Other: No ascites or pneumoperitoneum. Musculoskeletal: Advanced lumbar disc and facet degeneration. Rib fracture described above. No acute osseous findings at the abdomen. Review of the MIP images confirms the above findings. IMPRESSION: 1. Small complex left effusion that is presumably hemothorax given segmental left seventh rib fracture that is displaced. Lingular atelectasis. Left chest wall swelling correlates with a presumed hematoma/seroma. 2. Hilar/mediastinal adenopathy and two 7 mm right lung nodules. This appearance could be seen with lymphoma, metastasis, or sarcoidosis. Recommend outpatient workup and chest CT in 3 months. 3. Moderately motion degraded study. No evidence of pulmonary embolism. 4. No acute intra-abdominal finding. 5. Prostatomegaly with asymmetric left-sided enlargement. Correlate with PSA. 6. Mild iliac adenopathy bilaterally. Significance to be determined based on workup of #2. 7.  Emphysema. (ICD10-J43.9), mild. 8.  Aortic Atherosclerosis (ICD10-170.0) Electronically Signed   By: Monte Fantasia M.D.   On: 06/29/2016 22:02    Results for RAYN, AMACKER (MRN [id number]) as of 06/29/2016 22:07  Ref. Range 06/06/2009 15:16 01/03/2011 17:00 02/20/2012 10:30 06/29/2016 17:15  Hemoglobin Latest Ref Range: 13.0-17.0 g/dL 15.4 17.1 (H) 17.1 (H) 11.0 (L)  HCT Latest Ref Range: 39.0-52.0  % 44.1 47.6 48.6 34.6 (L)     2215:  Pt intermittently pressing on his fx rib; reminded not to do so, verb understanding. Pt and family continue to deny any trauma to chest or flank. Family only "just noticed" flank ecchymosis. CT scan as above. H/H lower than previous. Will admit. Dx and testing d/w pt and family.  Questions answered.  Verb understanding, agreeable to admit. T/C to  [facility] Dr. Grandville Silos, case discussed, including:  HPI, pertinent PM/SHx, VS/PE, dx testing, ED course and treatment:  Agreeable to admit, requests to write temporary orders, obtain [place] bed to Trauma service.    Francine Graven, DO 06/30/16 2226

## 2016-06-29 NOTE — ED Notes (Signed)
Patient does not speak [place]. Family member interprets patient has been complaining of intermittent chest pain and shortness of breath x 1 month. States he was seen by [facility] today and sent to ER "to see what that lump is on the left side of his chest." Patient denies chest pain at triage.

## 2016-06-29 NOTE — ED Notes (Signed)
Pt returned from CT °

## 2016-06-30 ENCOUNTER — Inpatient Hospital Stay (HOSPITAL_COMMUNITY): Payer: Medicare Other

## 2016-06-30 ENCOUNTER — Encounter (HOSPITAL_COMMUNITY): Payer: Self-pay | Admitting: *Deleted

## 2016-06-30 DIAGNOSIS — J942 Hemothorax: Secondary | ICD-10-CM | POA: Diagnosis present

## 2016-06-30 MED ORDER — ONDANSETRON HCL 4 MG PO TABS: 4.0000 mg | ORAL_TABLET | Freq: Four times a day (QID) | ORAL | Status: DC | PRN

## 2016-06-30 MED ORDER — ACETAMINOPHEN 325 MG PO TABS
650.0000 mg | ORAL_TABLET | ORAL | Status: DC | PRN
  Administered 2016-07-01: 650 mg via ORAL
  Filled 2016-06-30: qty 2

## 2016-06-30 MED ORDER — TRAMADOL HCL 50 MG PO TABS
50.0000 mg | ORAL_TABLET | Freq: Four times a day (QID) | ORAL | Status: DC | PRN
  Administered 2016-06-30 – 2016-07-02 (×5): 50 mg via ORAL
  Filled 2016-06-30 (×5): qty 1

## 2016-06-30 MED ORDER — ONDANSETRON HCL 4 MG/2ML IJ SOLN: 4.0000 mg | Freq: Four times a day (QID) | INTRAMUSCULAR | Status: DC | PRN

## 2016-06-30 MED ORDER — PNEUMOCOCCAL VAC POLYVALENT 25 MCG/0.5ML IJ INJ
0.5000 mL | INJECTION | INTRAMUSCULAR | Status: AC
  Administered 2016-07-01: 0.5 mL via INTRAMUSCULAR
  Filled 2016-06-30: qty 0.5

## 2016-06-30 MED ORDER — IPRATROPIUM-ALBUTEROL 0.5-2.5 (3) MG/3ML IN SOLN
3.0000 mL | RESPIRATORY_TRACT | Status: DC | PRN
  Administered 2016-06-30 – 2016-07-03 (×8): 3 mL via RESPIRATORY_TRACT
  Filled 2016-06-30 (×8): qty 3

## 2016-06-30 NOTE — H&P (Signed)
Martin Cortez is an 75 y.o. male.   Chief Complaint: Left rib deformity, shortness of breath HPI: Martin Cortez presented to the [facility] emergency department earlier tonight complaining of a popping sensation on his ribs on the left side as well as intermittent shortness of breath. Workup there revealed left seventh rib fracture with associated hemothorax. I accepted him in transfer to the trauma service. I spoke with him and his son at length. There is no history of trauma. He was admitted to the hospital in [place] about 6 weeks ago for a severe pneumonia. This rib popping has been bothering him since that time. Additionally complains of some generalized weakness. He denies fevers or night sweats. Incidentally on his CAT scans today he was found to have hilar, mediastinal, and iliac adenopathy.  Past Medical History  Diagnosis Date  . Arthritis   . Bladder stone   . Sleep apnea     STOP BANG score= 5    Past Surgical History  Procedure Laterality Date  . Cystoscopy  2012    [facility], [facility]  . Umbilical hernia repair  01/08/11    incarcerated, [facility], [facility]  . Cystoscopy with litholapaxy  06/07/09    [facility], [place], spinal  . Cystoscopy with litholapaxy  02/21/2012    Procedure: CYSTOSCOPY WITH LITHOLAPAXY;  Surgeon: Marissa Nestle, MD;  Location: [facility] ORS;  Service: Urology;  Laterality: N/A;  . Colonoscopy N/A 03/08/2016    Procedure: COLONOSCOPY;  Surgeon: Daneil Dolin, MD;  Location: [facility] ENDO SUITE;  Service: Endoscopy;  Laterality: N/A;  9:30 AM - interpreter scheduled - do NOT move    History reviewed. No pertinent family history. Social History:  reports that he has quit smoking. He does not have any smokeless tobacco history on file. He reports that he does not drink alcohol or use illicit drugs.  Allergies: No Known Allergies  Medications Prior to Admission  Medication Sig Dispense Refill  . albuterol (PROVENTIL HFA;VENTOLIN HFA) 108 (90 Base) MCG/ACT inhaler Inhale 1-2  puffs into the lungs every 6 (six) hours as needed for wheezing or shortness of breath.    . Fluticasone-Salmeterol (ADVAIR) 250-50 MCG/DOSE AEPB Inhale 1 puff into the lungs 2 (two) times daily.      Results for orders placed or performed during the hospital encounter of 06/29/16 (from the past 48 hour(s))  Hepatic function panel     Status: Abnormal   Collection Time: 06/29/16  5:10 PM  Result Value Ref Range   Total Protein 7.3 6.5 - 8.1 g/dL   Albumin 2.8 (L) 3.5 - 5.0 g/dL   AST 22 15 - 41 U/L   ALT 17 17 - 63 U/L   Alkaline Phosphatase 91 38 - 126 U/L   Total Bilirubin 0.8 0.3 - 1.2 mg/dL   Bilirubin, Direct 0.2 0.1 - 0.5 mg/dL   Indirect Bilirubin 0.6 0.3 - 0.9 mg/dL  Protime-INR     Status: None   Collection Time: 06/29/16  5:10 PM  Result Value Ref Range   Prothrombin Time 14.1 11.6 - 15.2 seconds   INR 1.07 0.00 - 5.68  Basic metabolic panel     Status: Abnormal   Collection Time: 06/29/16  5:15 PM  Result Value Ref Range   Sodium 137 135 - 145 mmol/L   Potassium 4.2 3.5 - 5.1 mmol/L   Chloride 107 101 - 111 mmol/L   CO2 25 22 - 32 mmol/L   Glucose, Bld 103 (H) 65 - 99 mg/dL   BUN  18 6 - 20 mg/dL   Creatinine, Ser 1.37 (H) 0.61 - 1.24 mg/dL   Calcium 8.3 (L) 8.9 - 10.3 mg/dL   GFR calc non Af Amer 49 (L) >60 mL/min   GFR calc Af Amer 57 (L) >60 mL/min    Comment: (NOTE) The eGFR has been calculated using the CKD EPI equation. This calculation has not been validated in all clinical situations. eGFR's persistently <60 mL/min signify possible Chronic Kidney Disease.    Anion gap 5 5 - 15  CBC     Status: Abnormal   Collection Time: 06/29/16  5:15 PM  Result Value Ref Range   WBC 14.2 (H) 4.0 - 10.5 K/uL   RBC 3.72 (L) 4.22 - 5.81 MIL/uL   Hemoglobin 11.0 (L) 13.0 - 17.0 g/dL   HCT 34.6 (L) 39.0 - 52.0 %   MCV 93.0 78.0 - 100.0 fL   MCH 29.6 26.0 - 34.0 pg   MCHC 31.8 30.0 - 36.0 g/dL   RDW 17.4 (H) 11.5 - 15.5 %   Platelets 288 150 - 400 K/uL  Troponin I      Status: None   Collection Time: 06/29/16  5:15 PM  Result Value Ref Range   Troponin I <0.03 <0.03 ng/mL  Urinalysis, Routine w reflex microscopic     Status: Abnormal   Collection Time: 06/29/16  6:28 PM  Result Value Ref Range   Color, Urine YELLOW YELLOW   APPearance CLEAR CLEAR   Specific Gravity, Urine 1.025 1.005 - 1.030   pH 5.5 5.0 - 8.0   Glucose, UA NEGATIVE NEGATIVE mg/dL   Hgb urine dipstick TRACE (A) NEGATIVE   Bilirubin Urine NEGATIVE NEGATIVE   Ketones, ur NEGATIVE NEGATIVE mg/dL   Protein, ur 100 (A) NEGATIVE mg/dL   Nitrite NEGATIVE NEGATIVE   Leukocytes, UA NEGATIVE NEGATIVE  Urine microscopic-add on     Status: Abnormal   Collection Time: 06/29/16  6:28 PM  Result Value Ref Range   Squamous Epithelial / LPF 0-5 (A) NONE SEEN   WBC, UA 0-5 0 - 5 WBC/hpf   RBC / HPF 0-5 0 - 5 RBC/hpf   Bacteria, UA RARE (A) NONE SEEN  I-Stat CG4 Lactic Acid, ED     Status: Abnormal   Collection Time: 06/29/16  6:31 PM  Result Value Ref Range   Lactic Acid, Venous 2.36 (HH) 0.5 - 1.9 mmol/L   Comment NOTIFIED PHYSICIAN   Sample to Blood Bank     Status: None   Collection Time: 06/29/16  7:57 PM  Result Value Ref Range   Blood Bank Specimen [id number]    Sample Expiration 06/30/2016    Dg Chest 2 View  06/29/2016  CLINICAL DATA:  Chest pain and shortness of breath. EXAM: CHEST  2 VIEW COMPARISON:  None. FINDINGS: Heart size is normal. Overall cardiomediastinal silhouette is normal in size and configuration. There is a dense opacity at the left lung base which appears to be confined to the lingula, most likely pneumonia. Right lung is clear. No pneumothorax seen. There is a displaced fracture of the left posterior-lateral seventh rib, of uncertain age. Mild degenerative spurring noted within the thoracic spine. IMPRESSION: 1. Dense opacity at the left lung base laterally, confined to the lingula based on the lateral projection, compatible with pneumonia or airspace collapse.  Questionable small associated pleural effusion. 2. Significantly displaced fracture of the left posterior-lateral seventh rib, of uncertain age. Electronically Signed   By: Franki Cabot M.D.   On:  06/29/2016 17:46   Ct Angio Chest Pe W/cm &/or Wo Cm  06/29/2016  CLINICAL DATA:  Chest pain, shortness of breath, and flank bruising. EXAM: CT ANGIOGRAPHY CHEST CT ABDOMEN AND PELVIS WITH CONTRAST TECHNIQUE: Multidetector CT imaging of the chest was performed using the standard protocol during bolus administration of intravenous contrast. Multiplanar CT image reconstructions and MIPs were obtained to evaluate the vascular anatomy. Multidetector CT imaging of the abdomen and pelvis was performed using the standard protocol during bolus administration of intravenous contrast. CONTRAST:  100 cc Isovue 370 intravenous COMPARISON:  None. FINDINGS: CTA CHEST FINDINGS Cardiovascular: There is intermittent respiratory motion with pulmonary artery blurring. No acute pulmonary embolism is seen, especially limited on the right beyond the segmental level. No evidence of acute aortic syndrome. Normal heart size without pericardial effusion. Mild scattered atheromatous calcification of the aorta. Mediastinum: Mediastinal and bilateral hilar adenopathy. Right lower peritracheal lymph node measures 17 mm short axis. No associated cavitation or calcification. Lungs/Pleura: There is a small left pleural effusion with incomplete layering and occasional pleural thickening and enhancement. This is presumably a hemothorax related to displaced left seventh segmental rib fracture with the lateral component 100% displaced. At the neck, there is subtle signs of healing suggesting subacute injury. This correlates with history of symptoms for 1 month. Lingular opacity with volume loss, likely related atelectasis. Diffuse airway thickening. There is a 8 by 5 mm (7 mm mean diameter) nodule in the right lower lobe 7:76 which could be bronchial  lymph node or pulmonary nodule. The same differential for a rounded 7 mm right upper lobe nodule 7:3. Mild centrilobular emphysema. Upper abdomen: See below Musculoskeletal: As above there is segmental fracture of the left seventh rib with displacement. There is edematous change around the left chest wall which is presumably traumatic. A discrete fluid collection deep to the lower latissimus Dorsey measures up to 44 mm and has a small focus of mural calcification, unexpected. CT ABDOMEN and PELVIS FINDINGS Lower chest and abdominal wall:  Fatty right inguinal hernia. Hepatobiliary: Sub cm low-density foci in the liver are likely incidental cysts.No evidence of biliary obstruction or stone. Pancreas: Unremarkable. Spleen: Unremarkable. Adrenals/Urinary Tract: Negative adrenals. Small appearing kidneys with numerous cysts. No hydronephrosis or stone. Unremarkable bladder. Stomach/Bowel:  No obstruction. No inflammatory changes. Reproductive:Prostate enlargement asymmetric to the left, projecting into the bladder base with heterogeneous central gland enhancement that is nonspecific. Vascular/Lymphatic: No acute vascular abnormality. 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 There are mildly enlarged bilateral external iliac lymph nodes measuring up to 12 mm on the left. Mild prominence of deep liver drainage lymph nodes, usually incidental and reactive. Haziness of fat at the root of the small bowel mesentery without adenopathy, sclerosing mesenteritis. Other: No ascites or pneumoperitoneum. Musculoskeletal: Advanced lumbar disc and facet degeneration. Rib fracture described above. No acute osseous findings at the abdomen. Review of the MIP images confirms the above findings. IMPRESSION: 1. Small complex left effusion that is presumably hemothorax given segmental left seventh rib fracture that is displaced. Lingular atelectasis. Left chest wall swelling correlates with a presumed  hematoma/seroma. 2. Hilar/mediastinal adenopathy and two 7 mm right lung nodules. This appearance could be seen with lymphoma, metastasis, or sarcoidosis. Recommend outpatient workup and chest CT in 3 months. 3. Moderately motion degraded study. No evidence of pulmonary embolism. 4. No acute intra-abdominal finding. 5. Prostatomegaly with asymmetric left-sided enlargement. Correlate with PSA. 6. Mild iliac adenopathy bilaterally. Significance to be determined based  on workup of #2. 7.  Emphysema. (ICD10-J43.9), mild. 8.  Aortic Atherosclerosis (ICD10-170.0) Electronically Signed   By: Monte Fantasia M.D.   On: 06/29/2016 22:02   Ct Abdomen Pelvis W Contrast  06/29/2016  CLINICAL DATA:  Chest pain, shortness of breath, and flank bruising. EXAM: CT ANGIOGRAPHY CHEST CT ABDOMEN AND PELVIS WITH CONTRAST TECHNIQUE: Multidetector CT imaging of the chest was performed using the standard protocol during bolus administration of intravenous contrast. Multiplanar CT image reconstructions and MIPs were obtained to evaluate the vascular anatomy. Multidetector CT imaging of the abdomen and pelvis was performed using the standard protocol during bolus administration of intravenous contrast. CONTRAST:  100 cc Isovue 370 intravenous COMPARISON:  None. FINDINGS: CTA CHEST FINDINGS Cardiovascular: There is intermittent respiratory motion with pulmonary artery blurring. No acute pulmonary embolism is seen, especially limited on the right beyond the segmental level. No evidence of acute aortic syndrome. Normal heart size without pericardial effusion. Mild scattered atheromatous calcification of the aorta. Mediastinum: Mediastinal and bilateral hilar adenopathy. Right lower peritracheal lymph node measures 17 mm short axis. No associated cavitation or calcification. Lungs/Pleura: There is a small left pleural effusion with incomplete layering and occasional pleural thickening and enhancement. This is presumably a hemothorax related  to displaced left seventh segmental rib fracture with the lateral component 100% displaced. At the neck, there is subtle signs of healing suggesting subacute injury. This correlates with history of symptoms for 1 month. Lingular opacity with volume loss, likely related atelectasis. Diffuse airway thickening. There is a 8 by 5 mm (7 mm mean diameter) nodule in the right lower lobe 7:76 which could be bronchial lymph node or pulmonary nodule. The same differential for a rounded 7 mm right upper lobe nodule 7:3. Mild centrilobular emphysema. Upper abdomen: See below Musculoskeletal: As above there is segmental fracture of the left seventh rib with displacement. There is edematous change around the left chest wall which is presumably traumatic. A discrete fluid collection deep to the lower latissimus Dorsey measures up to 44 mm and has a small focus of mural calcification, unexpected. CT ABDOMEN and PELVIS FINDINGS Lower chest and abdominal wall:  Fatty right inguinal hernia. Hepatobiliary: Sub cm low-density foci in the liver are likely incidental cysts.No evidence of biliary obstruction or stone. Pancreas: Unremarkable. Spleen: Unremarkable. Adrenals/Urinary Tract: Negative adrenals. Small appearing kidneys with numerous cysts. No hydronephrosis or stone. Unremarkable bladder. Stomach/Bowel:  No obstruction. No inflammatory changes. Reproductive:Prostate enlargement asymmetric to the left, projecting into the bladder base with heterogeneous central gland enhancement that is nonspecific. Vascular/Lymphatic: No acute vascular abnormality. 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 There are mildly enlarged bilateral external iliac lymph nodes measuring up to 12 mm on the left. Mild prominence of deep liver drainage lymph nodes, usually incidental and reactive. Haziness of fat at the root of the small bowel mesentery without adenopathy, sclerosing mesenteritis. Other: No ascites or  pneumoperitoneum. Musculoskeletal: Advanced lumbar disc and facet degeneration. Rib fracture described above. No acute osseous findings at the abdomen. Review of the MIP images confirms the above findings. IMPRESSION: 1. Small complex left effusion that is presumably hemothorax given segmental left seventh rib fracture that is displaced. Lingular atelectasis. Left chest wall swelling correlates with a presumed hematoma/seroma. 2. Hilar/mediastinal adenopathy and two 7 mm right lung nodules. This appearance could be seen with lymphoma, metastasis, or sarcoidosis. Recommend outpatient workup and chest CT in 3 months. 3. Moderately motion degraded study. No evidence of pulmonary embolism. 4.  No acute intra-abdominal finding. 5. Prostatomegaly with asymmetric left-sided enlargement. Correlate with PSA. 6. Mild iliac adenopathy bilaterally. Significance to be determined based on workup of #2. 7.  Emphysema. (ICD10-J43.9), mild. 8.  Aortic Atherosclerosis (ICD10-170.0) Electronically Signed   By: Monte Fantasia M.D.   On: 06/29/2016 22:02    Review of Systems  Constitutional: Negative for fever and chills.  Eyes: Negative for blurred vision.  Respiratory: Positive for shortness of breath.   Cardiovascular: Positive for chest pain.       No central chest pain, left lateral chest discomfort with rib popping sensation  Gastrointestinal: Negative for nausea, vomiting, abdominal pain, diarrhea and constipation.  Genitourinary:       History of bladder stones  Musculoskeletal: Negative.   Skin: Negative.   Neurological: Negative.  Negative for headaches.  Endo/Heme/Allergies: Negative.   Psychiatric/Behavioral: Negative.     Blood pressure 140/67, pulse 101, temperature 99.2 F (37.3 C), temperature source Oral, resp. rate 21, height 5' 8"  (1.727 m), weight 78.608 kg (173 lb 4.8 oz), SpO2 98 %. Physical Exam  Constitutional: He is oriented to person, place, and time. He appears well-developed and  well-nourished. No distress.  HENT:  Head: Normocephalic.  Right Ear: External ear normal.  Left Ear: External ear normal.  Nose: Nose normal.  Mouth/Throat: Oropharynx is clear and moist.  Eyes: EOM are normal. Pupils are equal, round, and reactive to light.  Neck: Neck supple. No tracheal deviation present.  Cardiovascular: Normal rate, regular rhythm, normal heart sounds and intact distal pulses.   Respiratory: Effort normal and breath sounds normal. No stridor. No respiratory distress. He has no wheezes. He has no rales.  GI: Soft. Bowel sounds are normal. He exhibits no distension. There is no tenderness. There is no rebound and no guarding.  Umbilical scar  Musculoskeletal: Normal range of motion. He exhibits no edema or tenderness.       Arms: Left back with large old evolving contusion  Neurological: He is alert and oriented to person, place, and time. He exhibits normal muscle tone.  Skin: Skin is warm.  Psychiatric: He has a normal mood and affect.     Assessment/Plan Left seventh rib fracture with small associated hemothorax - this seems chronic in appearance and in accordance with his history. No chest tube needed at this time. Follow-up chest x-ray in a.m. If the fluid collection worsens, consider IR thoracentesis. Hilar, mediastinal, and iliac adenopathy - will need further outpatient workup. He reports he had a colonoscopy 3 months ago.  admit  Zenovia Jarred, MD 06/30/2016, 12:26 AM

## 2016-06-30 NOTE — Progress Notes (Signed)
Correction 97.9

## 2016-06-30 NOTE — Progress Notes (Signed)
Subjective: Complains of pain in left chest and right knee. Denies fall  Objective: Vital signs in last 24 hours: Temp:  [98.5 F (36.9 C)-99.5 F (37.5 C)] 99.5 F (37.5 C) (07/22 0619) Pulse Rate:  [91-113] 105 (07/22 0619) Resp:  [16-27] 20 (07/22 0619) BP: (107-140)/(60-87) 122/60 mmHg (07/22 0619) SpO2:  [94 %-98 %] 95 % (07/22 0619) Weight:  [77.111 kg (170 lb)-78.608 kg (173 lb 4.8 oz)] 78.608 kg (173 lb 4.8 oz) (07/21 2359) Last BM Date: 06/29/16  Intake/Output from previous day: 07/21 0701 - 07/22 0700 In: 2075 [P.O.:220; I.V.:1855] Out: -  Intake/Output this shift: Total I/O In: 240 [P.O.:240] Out: -   Resp: wheezes bilaterally Cardio: regular rate and rhythm GI: soft, minimal tenderness  Lab Results:   Recent Labs  06/29/16 1715  WBC 14.2*  HGB 11.0*  HCT 34.6*  PLT 288   BMET  Recent Labs  06/29/16 1715  NA 137  K 4.2  CL 107  CO2 25  GLUCOSE 103*  BUN 18  CREATININE 1.37*  CALCIUM 8.3*   PT/INR  Recent Labs  06/29/16 1710  LABPROT 14.1  INR 1.07   ABG No results for input(s): PHART, HCO3 in the last 72 hours.  Invalid input(s): PCO2, PO2  Studies/Results: Dg Chest 2 View  06/29/2016  CLINICAL DATA:  Chest pain and shortness of breath. EXAM: CHEST  2 VIEW COMPARISON:  None. FINDINGS: Heart size is normal. Overall cardiomediastinal silhouette is normal in size and configuration. There is a dense opacity at the left lung base which appears to be confined to the lingula, most likely pneumonia. Right lung is clear. No pneumothorax seen. There is a displaced fracture of the left posterior-lateral seventh rib, of uncertain age. Mild degenerative spurring noted within the thoracic spine. IMPRESSION: 1. Dense opacity at the left lung base laterally, confined to the lingula based on the lateral projection, compatible with pneumonia or airspace collapse. Questionable small associated pleural effusion. 2. Significantly displaced fracture of the  left posterior-lateral seventh rib, of uncertain age. Electronically Signed   By: Franki Cabot M.D.   On: 06/29/2016 17:46   Ct Angio Chest Pe W/cm &/or Wo Cm  06/29/2016  CLINICAL DATA:  Chest pain, shortness of breath, and flank bruising. EXAM: CT ANGIOGRAPHY CHEST CT ABDOMEN AND PELVIS WITH CONTRAST TECHNIQUE: Multidetector CT imaging of the chest was performed using the standard protocol during bolus administration of intravenous contrast. Multiplanar CT image reconstructions and MIPs were obtained to evaluate the vascular anatomy. Multidetector CT imaging of the abdomen and pelvis was performed using the standard protocol during bolus administration of intravenous contrast. CONTRAST:  100 cc Isovue 370 intravenous COMPARISON:  None. FINDINGS: CTA CHEST FINDINGS Cardiovascular: There is intermittent respiratory motion with pulmonary artery blurring. No acute pulmonary embolism is seen, especially limited on the right beyond the segmental level. No evidence of acute aortic syndrome. Normal heart size without pericardial effusion. Mild scattered atheromatous calcification of the aorta. Mediastinum: Mediastinal and bilateral hilar adenopathy. Right lower peritracheal lymph node measures 17 mm short axis. No associated cavitation or calcification. Lungs/Pleura: There is a small left pleural effusion with incomplete layering and occasional pleural thickening and enhancement. This is presumably a hemothorax related to displaced left seventh segmental rib fracture with the lateral component 100% displaced. At the neck, there is subtle signs of healing suggesting subacute injury. This correlates with history of symptoms for 1 month. Lingular opacity with volume loss, likely related atelectasis. Diffuse airway thickening. There is a  8 by 5 mm (7 mm mean diameter) nodule in the right lower lobe 7:76 which could be bronchial lymph node or pulmonary nodule. The same differential for a rounded 7 mm right upper lobe  nodule 7:3. Mild centrilobular emphysema. Upper abdomen: See below Musculoskeletal: As above there is segmental fracture of the left seventh rib with displacement. There is edematous change around the left chest wall which is presumably traumatic. A discrete fluid collection deep to the lower latissimus Dorsey measures up to 44 mm and has a small focus of mural calcification, unexpected. CT ABDOMEN and PELVIS FINDINGS Lower chest and abdominal wall:  Fatty right inguinal hernia. Hepatobiliary: Sub cm low-density foci in the liver are likely incidental cysts.No evidence of biliary obstruction or stone. Pancreas: Unremarkable. Spleen: Unremarkable. Adrenals/Urinary Tract: Negative adrenals. Small appearing kidneys with numerous cysts. No hydronephrosis or stone. Unremarkable bladder. Stomach/Bowel:  No obstruction. No inflammatory changes. Reproductive:Prostate enlargement asymmetric to the left, projecting into the bladder base with heterogeneous central gland enhancement that is nonspecific. Vascular/Lymphatic: No acute vascular abnormality. 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 There are mildly enlarged bilateral external iliac lymph nodes measuring up to 12 mm on the left. Mild prominence of deep liver drainage lymph nodes, usually incidental and reactive. Haziness of fat at the root of the small bowel mesentery without adenopathy, sclerosing mesenteritis. Other: No ascites or pneumoperitoneum. Musculoskeletal: Advanced lumbar disc and facet degeneration. Rib fracture described above. No acute osseous findings at the abdomen. Review of the MIP images confirms the above findings. IMPRESSION: 1. Small complex left effusion that is presumably hemothorax given segmental left seventh rib fracture that is displaced. Lingular atelectasis. Left chest wall swelling correlates with a presumed hematoma/seroma. 2. Hilar/mediastinal adenopathy and two 7 mm right lung nodules. This appearance  could be seen with lymphoma, metastasis, or sarcoidosis. Recommend outpatient workup and chest CT in 3 months. 3. Moderately motion degraded study. No evidence of pulmonary embolism. 4. No acute intra-abdominal finding. 5. Prostatomegaly with asymmetric left-sided enlargement. Correlate with PSA. 6. Mild iliac adenopathy bilaterally. Significance to be determined based on workup of #2. 7.  Emphysema. (ICD10-J43.9), mild. 8.  Aortic Atherosclerosis (ICD10-170.0) Electronically Signed   By: Monte Fantasia M.D.   On: 06/29/2016 22:02   Ct Abdomen Pelvis W Contrast  06/29/2016  CLINICAL DATA:  Chest pain, shortness of breath, and flank bruising. EXAM: CT ANGIOGRAPHY CHEST CT ABDOMEN AND PELVIS WITH CONTRAST TECHNIQUE: Multidetector CT imaging of the chest was performed using the standard protocol during bolus administration of intravenous contrast. Multiplanar CT image reconstructions and MIPs were obtained to evaluate the vascular anatomy. Multidetector CT imaging of the abdomen and pelvis was performed using the standard protocol during bolus administration of intravenous contrast. CONTRAST:  100 cc Isovue 370 intravenous COMPARISON:  None. FINDINGS: CTA CHEST FINDINGS Cardiovascular: There is intermittent respiratory motion with pulmonary artery blurring. No acute pulmonary embolism is seen, especially limited on the right beyond the segmental level. No evidence of acute aortic syndrome. Normal heart size without pericardial effusion. Mild scattered atheromatous calcification of the aorta. Mediastinum: Mediastinal and bilateral hilar adenopathy. Right lower peritracheal lymph node measures 17 mm short axis. No associated cavitation or calcification. Lungs/Pleura: There is a small left pleural effusion with incomplete layering and occasional pleural thickening and enhancement. This is presumably a hemothorax related to displaced left seventh segmental rib fracture with the lateral component 100% displaced. At the  neck, there is subtle signs of healing suggesting subacute  injury. This correlates with history of symptoms for 1 month. Lingular opacity with volume loss, likely related atelectasis. Diffuse airway thickening. There is a 8 by 5 mm (7 mm mean diameter) nodule in the right lower lobe 7:76 which could be bronchial lymph node or pulmonary nodule. The same differential for a rounded 7 mm right upper lobe nodule 7:3. Mild centrilobular emphysema. Upper abdomen: See below Musculoskeletal: As above there is segmental fracture of the left seventh rib with displacement. There is edematous change around the left chest wall which is presumably traumatic. A discrete fluid collection deep to the lower latissimus Dorsey measures up to 44 mm and has a small focus of mural calcification, unexpected. CT ABDOMEN and PELVIS FINDINGS Lower chest and abdominal wall:  Fatty right inguinal hernia. Hepatobiliary: Sub cm low-density foci in the liver are likely incidental cysts.No evidence of biliary obstruction or stone. Pancreas: Unremarkable. Spleen: Unremarkable. Adrenals/Urinary Tract: Negative adrenals. Small appearing kidneys with numerous cysts. No hydronephrosis or stone. Unremarkable bladder. Stomach/Bowel:  No obstruction. No inflammatory changes. Reproductive:Prostate enlargement asymmetric to the left, projecting into the bladder base with heterogeneous central gland enhancement that is nonspecific. Vascular/Lymphatic: No acute vascular abnormality. 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 There are mildly enlarged bilateral external iliac lymph nodes measuring up to 12 mm on the left. Mild prominence of deep liver drainage lymph nodes, usually incidental and reactive. Haziness of fat at the root of the small bowel mesentery without adenopathy, sclerosing mesenteritis. Other: No ascites or pneumoperitoneum. Musculoskeletal: Advanced lumbar disc and facet degeneration. Rib fracture described  above. No acute osseous findings at the abdomen. Review of the MIP images confirms the above findings. IMPRESSION: 1. Small complex left effusion that is presumably hemothorax given segmental left seventh rib fracture that is displaced. Lingular atelectasis. Left chest wall swelling correlates with a presumed hematoma/seroma. 2. Hilar/mediastinal adenopathy and two 7 mm right lung nodules. This appearance could be seen with lymphoma, metastasis, or sarcoidosis. Recommend outpatient workup and chest CT in 3 months. 3. Moderately motion degraded study. No evidence of pulmonary embolism. 4. No acute intra-abdominal finding. 5. Prostatomegaly with asymmetric left-sided enlargement. Correlate with PSA. 6. Mild iliac adenopathy bilaterally. Significance to be determined based on workup of #2. 7.  Emphysema. (ICD10-J43.9), mild. 8.  Aortic Atherosclerosis (ICD10-170.0) Electronically Signed   By: Monte Fantasia M.D.   On: 06/29/2016 22:02   Dg Chest Port 1 View  06/30/2016  CLINICAL DATA:  Left hemothorax, shortness of breath, recent pneumonia. EXAM: PORTABLE CHEST 1 VIEW COMPARISON:  CT chest and chest radiograph 06/29/2016. FINDINGS: Trachea is midline. Heart size normal. Pleural parenchymal opacification of the lateral base of the left hemi thorax is unchanged. Right lung is clear. IMPRESSION: Pleural parenchymal opacification at the base of the left hemi thorax corresponds to a small left pleural effusion and adjacent collapse/ consolidation on 06/29/2016. Findings are unchanged. Associated left seventh rib fracture is better visualized on the comparison exam. Electronically Signed   By: Lorin Picket M.D.   On: 06/30/2016 10:28    Anti-infectives: Anti-infectives    None      Assessment/Plan: s/p * No surgery found * Advance diet  Pain control for rib fxs Mediastinal adenopathy. Will need w/u  LOS: 1 day    TOTH III,Kellan Raffield S 06/30/2016

## 2016-06-30 NOTE — Progress Notes (Signed)
Pt. Has had an increase in wheezing, coughing and pain throughout the evening. Albuterol treatments have been given with no change. Physician was paged, fluids have been stopped until further instructions are given.  Will continue to monitor. Jimmie Molly, RN

## 2016-06-30 NOTE — Progress Notes (Signed)
Physical Therapy Evaluation Patient Details Name: Martin Cortez MRN: [id number] DOB: 07-Mar-1941 Today's Date: 06/30/2016   History of Present Illness  75 yo male, recent history of pneumonia in [place] (~May 2017), Left 7th rib fracture with hematoma  Clinical Impression  Patient presents with overall mild/moderate level of physical impairment for mobility.  Patient reports mild pain except when he coughs, has shortness of breath and 'gastritis' (discomfort after eating, reflux?), and weakness in his legs since his pneumonia 6 weeks ago.  Other diffuse complaints of mild intensity during evaluation.  Noted weakness in legs especially in pelvic girdle, able to ambulate with UE support at slower pace.  Balance impaired at baseline and also as result of weakness.  Patient may benefit from trunk compression to stabilize rib fracture pain when coughing, and will benefit from skilled PT services for general strength and conditioning.  Patient has family support on discharge, so is able to return when medically appropriate.  Will continue PT while in hospital.     Follow Up Recommendations Home health PT    Equipment Recommendations  None recommended by PT (Patient reports gravel driveway and walker would not work)    Recommendations for Other Services       Precautions / Restrictions Precautions Precautions: Fall Precaution Comments: Patient uses cane at baseline Restrictions Weight Bearing Restrictions: No      Mobility  Bed Mobility Overal bed mobility: Modified Independent                Transfers Overall transfer level: Modified independent Equipment used: Ambulation equipment used (either IV pole, walker, or cane)                Ambulation/Gait Ambulation/Gait assistance: Supervision Ambulation Distance (Feet): 100 Feet Assistive device:  (IV pole used in hallway, walker and cane trials in room) Gait Pattern/deviations: Step-through pattern;Wide base of  support   Gait velocity interpretation: Below normal speed for age/gender General Gait Details: Slow pace, lateral trunk sway, reports feeling weak in legs  Stairs            Wheelchair Mobility    Modified Rankin (Stroke Patients Only)       Balance Overall balance assessment: Needs assistance   Sitting balance-Leahy Scale: Good     Standing balance support: Single extremity supported Standing balance-Leahy Scale: Fair                               Pertinent Vitals/Pain Pain Assessment: 0-10 Pain Score: 3  Faces Pain Scale: Hurts a little bit (reported as mild, except when he coughs) Pain Location: Left side / ribs Pain Descriptors / Indicators: Sharp;Sore Pain Intervention(s): Monitored during session    Home Living Family/patient expects to be discharged to:: Private residence Living Arrangements: Children Available Help at Discharge: Family Type of Home: House       Home Layout: One level Home Equipment: Kasandra Knudsen - single point      Prior Function Level of Independence: Independent with assistive device(s)         Comments: Uses cane at baseline     Hand Dominance        Extremity/Trunk Assessment   Upper Extremity Assessment: Overall WFL for tasks assessed           Lower Extremity Assessment: Overall WFL for tasks assessed      Cervical / Trunk Assessment: Normal  Communication   Communication: Prefers language other than English (  Spanish speaking)  Cognition Arousal/Alertness: Awake/alert Behavior During Therapy: WFL for tasks assessed/performed Overall Cognitive Status: Within Functional Limits for tasks assessed                      General Comments General comments (skin integrity, edema, etc.): Bruises noted on back and Left trunk, from hematoma likely    Exercises General Exercises - Lower Extremity Long Arc Quad: AROM;Both;5 reps;Seated Hip Flexion/Marching: AROM;Both;5 reps;Seated Other  Exercises Other Exercises: Sit to stand      Assessment/Plan    PT Assessment Patient needs continued PT services  PT Diagnosis Difficulty walking;Generalized weakness;Acute pain   PT Problem List Decreased strength;Decreased activity tolerance;Decreased balance;Decreased mobility;Cardiopulmonary status limiting activity;Pain  PT Treatment Interventions Gait training;Functional mobility training;Therapeutic activities;Therapeutic exercise;Balance training;Patient/family education   PT Goals (Current goals can be found in the Care Plan section) Acute Rehab PT Goals Patient Stated Goal: Get stronger PT Goal Formulation: With patient/family Time For Goal Achievement: 07/14/16 Potential to Achieve Goals: Good    Frequency Min 3X/week   Barriers to discharge        Co-evaluation               End of Session Equipment Utilized During Treatment: Gait belt Activity Tolerance: Patient tolerated treatment well Patient left: in bed;with call bell/phone within reach;with family/visitor present;with SCD's reapplied Nurse Communication: Mobility status         Time: 1020-1100 PT Time Calculation (min) (ACUTE ONLY): 40 min   Charges:   PT Evaluation $PT Eval Moderate Complexity: 1 Procedure PT Treatments $Therapeutic Activity: 8-22 mins   PT G Codes:        Anastasiya Gowin L 2016-07-07, 11:59 AM

## 2016-07-01 ENCOUNTER — Inpatient Hospital Stay (HOSPITAL_COMMUNITY): Payer: Medicare Other

## 2016-07-01 DIAGNOSIS — S2232XA Fracture of one rib, left side, initial encounter for closed fracture: Secondary | ICD-10-CM | POA: Diagnosis not present

## 2016-07-01 MED ORDER — BISACODYL 10 MG RE SUPP
10.0000 mg | Freq: Every day | RECTAL | Status: DC | PRN
  Administered 2016-07-01: 10 mg via RECTAL
  Filled 2016-07-01: qty 1

## 2016-07-01 MED ORDER — MORPHINE SULFATE (PF) 2 MG/ML IV SOLN
1.0000 mg | INTRAVENOUS | Status: DC | PRN
  Administered 2016-07-01 – 2016-07-03 (×3): 2 mg via INTRAVENOUS
  Filled 2016-07-01 (×2): qty 1

## 2016-07-01 MED ORDER — FUROSEMIDE 10 MG/ML IJ SOLN
20.0000 mg | Freq: Once | INTRAMUSCULAR | Status: AC
  Administered 2016-07-01: 03:00:00 via INTRAVENOUS

## 2016-07-01 MED ORDER — GUAIFENESIN 100 MG/5ML PO SOLN
5.0000 mL | ORAL | Status: DC | PRN
  Administered 2016-07-01 – 2016-07-02 (×5): 100 mg via ORAL
  Filled 2016-07-01: qty 5
  Filled 2016-07-01: qty 25
  Filled 2016-07-01 (×3): qty 5

## 2016-07-01 MED ORDER — FUROSEMIDE 10 MG/ML IJ SOLN
INTRAMUSCULAR | Status: AC
  Filled 2016-07-01: qty 2

## 2016-07-01 MED ORDER — MORPHINE SULFATE (PF) 2 MG/ML IV SOLN
INTRAVENOUS | Status: AC
  Administered 2016-07-01: 2 mg via INTRAVENOUS
  Filled 2016-07-01: qty 1

## 2016-07-01 NOTE — Progress Notes (Signed)
Subjective: Complains of a cough. Wheezing overnight  Objective: Vital signs in last 24 hours: Temp:  [97.9 F (36.6 C)-98.6 F (37 C)] 98 F (36.7 C) (07/23 0521) Pulse Rate:  [85-90] 90 (07/23 0521) Resp:  [17-19] 17 (07/23 0521) BP: (131-142)/(70-83) 142/83 (07/23 0521) SpO2:  [99 %-100 %] 99 % (07/23 0521) Last BM Date: 06/29/16  Intake/Output from previous day: 07/22 0701 - 07/23 0700 In: 1916.3 [P.O.:710; I.V.:1206.3] Out: -  Intake/Output this shift: No intake/output data recorded.  Resp: wheezes bilaterally Cardio: regular rate and rhythm GI: soft, nontender  Lab Results:   Recent Labs  06/29/16 1715  WBC 14.2*  HGB 11.0*  HCT 34.6*  PLT 288   BMET  Recent Labs  06/29/16 1715  NA 137  K 4.2  CL 107  CO2 25  GLUCOSE 103*  BUN 18  CREATININE 1.37*  CALCIUM 8.3*   PT/INR  Recent Labs  06/29/16 1710  LABPROT 14.1  INR 1.07   ABG No results for input([facility]): PHART, HCO3 in the last 72 hours.  Invalid input([facility]): PCO2, PO2  Studies/Results: Dg Chest 2 View  Result Date: 06/29/2016 CLINICAL DATA:  Chest pain and shortness of breath. EXAM: CHEST  2 VIEW COMPARISON:  None. FINDINGS: Heart size is normal. Overall cardiomediastinal silhouette is normal in size and configuration. There is a dense opacity at the left lung base which appears to be confined to the lingula, most likely pneumonia. Right lung is clear. No pneumothorax seen. There is a displaced fracture of the left posterior-lateral seventh rib, of uncertain age. Mild degenerative spurring noted within the thoracic spine. IMPRESSION: 1. Dense opacity at the left lung base laterally, confined to the lingula based on the lateral projection, compatible with pneumonia or airspace collapse. Questionable small associated pleural effusion. 2. Significantly displaced fracture of the left posterior-lateral seventh rib, of uncertain age. Electronically Signed   By: Franki Cabot M.D.   On: 06/29/2016 17:46    Ct Angio Chest Pe W/cm &/or Wo Cm  Result Date: 06/29/2016 CLINICAL DATA:  Chest pain, shortness of breath, and flank bruising. EXAM: CT ANGIOGRAPHY CHEST CT ABDOMEN AND PELVIS WITH CONTRAST TECHNIQUE: Multidetector CT imaging of the chest was performed using the standard protocol during bolus administration of intravenous contrast. Multiplanar CT image reconstructions and MIPs were obtained to evaluate the vascular anatomy. Multidetector CT imaging of the abdomen and pelvis was performed using the standard protocol during bolus administration of intravenous contrast. CONTRAST:  100 cc Isovue 370 intravenous COMPARISON:  None. FINDINGS: CTA CHEST FINDINGS Cardiovascular: There is intermittent respiratory motion with pulmonary artery blurring. No acute pulmonary embolism is seen, especially limited on the right beyond the segmental level. No evidence of acute aortic syndrome. Normal heart size without pericardial effusion. Mild scattered atheromatous calcification of the aorta. Mediastinum: Mediastinal and bilateral hilar adenopathy. Right lower peritracheal lymph node measures 17 mm short axis. No associated cavitation or calcification. Lungs/Pleura: There is a small left pleural effusion with incomplete layering and occasional pleural thickening and enhancement. This is presumably a hemothorax related to displaced left seventh segmental rib fracture with the lateral component 100% displaced. At the neck, there is subtle signs of healing suggesting subacute injury. This correlates with history of symptoms for 1 month. Lingular opacity with volume loss, likely related atelectasis. Diffuse airway thickening. There is a 8 by 5 mm (7 mm mean diameter) nodule in the right lower lobe 7:76 which could be bronchial lymph node or pulmonary nodule. The same differential for  a rounded 7 mm right upper lobe nodule 7:3. Mild centrilobular emphysema. Upper abdomen: See below Musculoskeletal: As above there is segmental  fracture of the left seventh rib with displacement. There is edematous change around the left chest wall which is presumably traumatic. A discrete fluid collection deep to the lower latissimus Dorsey measures up to 44 mm and has a small focus of mural calcification, unexpected. CT ABDOMEN and PELVIS FINDINGS Lower chest and abdominal wall:  Fatty right inguinal hernia. Hepatobiliary: Sub cm low-density foci in the liver are likely incidental cysts.No evidence of biliary obstruction or stone. Pancreas: Unremarkable. Spleen: Unremarkable. Adrenals/Urinary Tract: Negative adrenals. Small appearing kidneys with numerous cysts. No hydronephrosis or stone. Unremarkable bladder. Stomach/Bowel:  No obstruction. No inflammatory changes. Reproductive:Prostate enlargement asymmetric to the left, projecting into the bladder base with heterogeneous central gland enhancement that is nonspecific. Vascular/Lymphatic: No acute vascular abnormality. 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 There are mildly enlarged bilateral external iliac lymph nodes measuring up to 12 mm on the left. Mild prominence of deep liver drainage lymph nodes, usually incidental and reactive. Haziness of fat at the root of the small bowel mesentery without adenopathy, sclerosing mesenteritis. Other: No ascites or pneumoperitoneum. Musculoskeletal: Advanced lumbar disc and facet degeneration. Rib fracture described above. No acute osseous findings at the abdomen. Review of the MIP images confirms the above findings. IMPRESSION: 1. Small complex left effusion that is presumably hemothorax given segmental left seventh rib fracture that is displaced. Lingular atelectasis. Left chest wall swelling correlates with a presumed hematoma/seroma. 2. Hilar/mediastinal adenopathy and two 7 mm right lung nodules. This appearance could be seen with lymphoma, metastasis, or sarcoidosis. Recommend outpatient workup and chest CT in 3 months.  3. Moderately motion degraded study. No evidence of pulmonary embolism. 4. No acute intra-abdominal finding. 5. Prostatomegaly with asymmetric left-sided enlargement. Correlate with PSA. 6. Mild iliac adenopathy bilaterally. Significance to be determined based on workup of #2. 7.  Emphysema. (ICD10-J43.9), mild. 8.  Aortic Atherosclerosis (ICD10-170.0) Electronically Signed   By: Monte Fantasia M.D.   On: 06/29/2016 22:02   Ct Abdomen Pelvis W Contrast  Result Date: 06/29/2016 CLINICAL DATA:  Chest pain, shortness of breath, and flank bruising. EXAM: CT ANGIOGRAPHY CHEST CT ABDOMEN AND PELVIS WITH CONTRAST TECHNIQUE: Multidetector CT imaging of the chest was performed using the standard protocol during bolus administration of intravenous contrast. Multiplanar CT image reconstructions and MIPs were obtained to evaluate the vascular anatomy. Multidetector CT imaging of the abdomen and pelvis was performed using the standard protocol during bolus administration of intravenous contrast. CONTRAST:  100 cc Isovue 370 intravenous COMPARISON:  None. FINDINGS: CTA CHEST FINDINGS Cardiovascular: There is intermittent respiratory motion with pulmonary artery blurring. No acute pulmonary embolism is seen, especially limited on the right beyond the segmental level. No evidence of acute aortic syndrome. Normal heart size without pericardial effusion. Mild scattered atheromatous calcification of the aorta. Mediastinum: Mediastinal and bilateral hilar adenopathy. Right lower peritracheal lymph node measures 17 mm short axis. No associated cavitation or calcification. Lungs/Pleura: There is a small left pleural effusion with incomplete layering and occasional pleural thickening and enhancement. This is presumably a hemothorax related to displaced left seventh segmental rib fracture with the lateral component 100% displaced. At the neck, there is subtle signs of healing suggesting subacute injury. This correlates with history of  symptoms for 1 month. Lingular opacity with volume loss, likely related atelectasis. Diffuse airway thickening. There is a 8 by 5  mm (7 mm mean diameter) nodule in the right lower lobe 7:76 which could be bronchial lymph node or pulmonary nodule. The same differential for a rounded 7 mm right upper lobe nodule 7:3. Mild centrilobular emphysema. Upper abdomen: See below Musculoskeletal: As above there is segmental fracture of the left seventh rib with displacement. There is edematous change around the left chest wall which is presumably traumatic. A discrete fluid collection deep to the lower latissimus Dorsey measures up to 44 mm and has a small focus of mural calcification, unexpected. CT ABDOMEN and PELVIS FINDINGS Lower chest and abdominal wall:  Fatty right inguinal hernia. Hepatobiliary: Sub cm low-density foci in the liver are likely incidental cysts.No evidence of biliary obstruction or stone. Pancreas: Unremarkable. Spleen: Unremarkable. Adrenals/Urinary Tract: Negative adrenals. Small appearing kidneys with numerous cysts. No hydronephrosis or stone. Unremarkable bladder. Stomach/Bowel:  No obstruction. No inflammatory changes. Reproductive:Prostate enlargement asymmetric to the left, projecting into the bladder base with heterogeneous central gland enhancement that is nonspecific. Vascular/Lymphatic: No acute vascular abnormality. 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 There are mildly enlarged bilateral external iliac lymph nodes measuring up to 12 mm on the left. Mild prominence of deep liver drainage lymph nodes, usually incidental and reactive. Haziness of fat at the root of the small bowel mesentery without adenopathy, sclerosing mesenteritis. Other: No ascites or pneumoperitoneum. Musculoskeletal: Advanced lumbar disc and facet degeneration. Rib fracture described above. No acute osseous findings at the abdomen. Review of the MIP images confirms the above findings.  IMPRESSION: 1. Small complex left effusion that is presumably hemothorax given segmental left seventh rib fracture that is displaced. Lingular atelectasis. Left chest wall swelling correlates with a presumed hematoma/seroma. 2. Hilar/mediastinal adenopathy and two 7 mm right lung nodules. This appearance could be seen with lymphoma, metastasis, or sarcoidosis. Recommend outpatient workup and chest CT in 3 months. 3. Moderately motion degraded study. No evidence of pulmonary embolism. 4. No acute intra-abdominal finding. 5. Prostatomegaly with asymmetric left-sided enlargement. Correlate with PSA. 6. Mild iliac adenopathy bilaterally. Significance to be determined based on workup of #2. 7.  Emphysema. (ICD10-J43.9), mild. 8.  Aortic Atherosclerosis (ICD10-170.0) Electronically Signed   By: Monte Fantasia M.D.   On: 06/29/2016 22:02   Dg Chest Port 1 View  Result Date: 06/30/2016 CLINICAL DATA:  Left hemothorax, shortness of breath, recent pneumonia. EXAM: PORTABLE CHEST 1 VIEW COMPARISON:  CT chest and chest radiograph 06/29/2016. FINDINGS: Trachea is midline. Heart size normal. Pleural parenchymal opacification of the lateral base of the left hemi thorax is unchanged. Right lung is clear. IMPRESSION: Pleural parenchymal opacification at the base of the left hemi thorax corresponds to a small left pleural effusion and adjacent collapse/ consolidation on 06/29/2016. Findings are unchanged. Associated left seventh rib fracture is better visualized on the comparison exam. Electronically Signed   By: Lorin Picket M.D.   On: 06/30/2016 10:28    Anti-infectives: Anti-infectives    None      Assessment/Plan: [facility]/p * No surgery found * Will get cxr today to evaluate wheezing. Continue nebulizer Start guaifenisin for cough Pain control for rib fx Mediastinal adenopathy still needs work up  LOS: 2 days    TOTH III,Martin [place] 07/01/2016

## 2016-07-01 NOTE — Progress Notes (Signed)
Called respiratory to give pt a PRN respiratory treatment for wheezing.

## 2016-07-02 DIAGNOSIS — R590 Localized enlarged lymph nodes: Secondary | ICD-10-CM

## 2016-07-02 NOTE — Consult Note (Signed)
[address]       [place] [address]             [phone]          CC: hilar and mediastinal adenopathy  HPI: The patient is a 75 year old male who was admitted to the trauma service. He was transferred from [facility] where he presented to the emergency department on the date of admission with a popping sensation of his ribs on the left side as well as intermittent shortness of breath. He was found to have a seventh rib fracture with associated hemothorax. He was transferred to [facility] for further management. He has no history of trauma. He was treated a hospital in [place] about 6 weeks ago for severe pneumonia and this sensation of the rib popping began around that time. This was a prolonged hospitalization lasting 3 weeks.  He is also had some generalized weakness. On CT scan he was found to have hilar, mediastinal, and iliac adenopathy. We are asked to consult regarding this finding for further evaluation diagnostic workup. He denies fevers or chills. He does have some productive yellow sputum production. He denies hemoptysis. He  smoked as as a young adult but quit many years ago. He has no previous pulmonary history but states has had a recent diagnosis of asthma.   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  Patient Active Problem List   Diagnosis Date Noted  . Hemothorax, left 06/30/2016  . Hemothorax on left 06/29/2016  . History of colonic polyps   . Diverticulosis of colon without hemorrhage    Past Medical History:  Diagnosis Date  . Arthritis   . Bladder stone   . Sleep apnea    STOP BANG score= 5    Past Surgical History:  Procedure Laterality Date  . COLONOSCOPY N/A 03/08/2016   Procedure: COLONOSCOPY;  Surgeon: Daneil Dolin, MD;  Location: [facility] SUITE;  Service: Endoscopy;  Laterality: N/A;  9:30 AM - interpreter scheduled - do NOT move   . CYSTOSCOPY  2012   [facility], [place]  . CYSTOSCOPY WITH LITHOLAPAXY  06/07/09   [facility], [place], spinal  . CYSTOSCOPY WITH LITHOLAPAXY  02/21/2012   Procedure: CYSTOSCOPY WITH LITHOLAPAXY;  Surgeon: Marissa Nestle, MD;  Location: [facility] ORS;  Service: Urology;  Laterality: N/A;  . UMBILICAL HERNIA REPAIR  01/08/11   incarcerated, [facility], [facility]    Prescriptions Prior to Admission  Medication Sig Dispense Refill Last Dose  . albuterol (PROVENTIL HFA;VENTOLIN HFA) 108 (90 Base) MCG/ACT inhaler Inhale 1-2 puffs into the lungs every 6 (six) hours as needed for wheezing or shortness of breath.   unknown  . Fluticasone-Salmeterol (ADVAIR) 250-50 MCG/DOSE AEPB Inhale 1 puff into the lungs 2 (two) times daily.   03/07/2016 at 1600   No Known Allergies  Social History  Substance Use Topics  . Smoking status: Former Research scientist (life sciences)  . Smokeless tobacco: Not on file  . Alcohol use No    History reviewed. No pertinent family history.  Review of Systems A comprehensive review of systems was negative except for: Constitutional: positive for weakness. GU: + urinary frequency and nocturia. Say he cannot urinate standing up?, ? H/o prostate problems,  - hematuria GI: + ulcer (Stomach) with post prandial abdominal discomfort, + constipation,  - hematemesis, - blood in stools noted, no nausea or vomiting Neuro:  neg review Skin + bruising left shoulder and flank   Objective:   Patient Vitals for the past 8 hrs:  BP Temp Temp src Pulse Resp SpO2  07/02/16 0523 138/71 98.8 F (37.1 C) Oral 90 17 100 %   General appearance: alert, cooperative, appears stated age and no distress Head: Normocephalic, without obvious abnormality, atraumatic Eyes: ? icteric vs muddy sclerae, + arcus senilis, Perrl, EOMI Throat: lips, mucosa, and tongue normal; teeth and gums normal Neck: no adenopathy, no carotid bruit, no JVD, supple, symmetrical, trachea midline and thyroid not enlarged, symmetric, no tenderness/mass/nodules Back:  symmetric, no curvature. ROM normal. No CVA tenderness., mild neck tenderness Lungs: coarse, dim Left base Chest wall: + tender left antero-lat ribs Heart: regular rate and rhythm, S1, S2 normal, no murmur, click, rub or gallop Abdomen: mod distension Extremities: extremities normal, atraumatic, no cyanosis or edema Pulses: 2+ and symmetric Neuro- grossly non-focal   Data Review:  CBC:  Lab Results  Component Value Date   WBC 14.2 (H) 06/29/2016   RBC 3.72 (L) 06/29/2016   BMP:  Lab Results  Component Value Date   GLUCOSE 103 (H) 06/29/2016   CO2 25 06/29/2016   BUN 18 06/29/2016   CREATININE 1.37 (H) 06/29/2016   CALCIUM 8.3 (L) 06/29/2016   Coagulation:  Lab Results  Component Value Date   INR 1.07 06/29/2016   Cardiac markers: No results found for: CKMB, TROPONINT, MYOGLOBIN ABGs: No results found for: [facility] Radiology review: Dg Chest 2 View  Result Date: 07/01/2016 CLINICAL DATA:  Pneumonia, cough, shortness of breath, left rib fracture EXAM: CHEST  2 VIEW COMPARISON:  06/30/2016 FINDINGS: Small left pleural effusion, unchanged. Right lung is clear.  No pneumothorax. The heart is normal in size. Nondisplaced left posterolateral 7th rib fracture. IMPRESSION: Small left pleural effusion, unchanged. Nondisplaced left posterolateral 7th rib fracture. Electronically Signed   By: Julian Hy M.D.   On: 07/01/2016 13:31  Dg Chest 2 View  Result Date: 06/29/2016 CLINICAL DATA:  Chest pain and shortness of breath. EXAM: CHEST  2 VIEW COMPARISON:  None. FINDINGS: Heart size is normal. Overall cardiomediastinal silhouette is normal in size and configuration. There is a dense opacity at the left lung base which appears to be confined to the lingula, most likely pneumonia. Right lung is clear. No pneumothorax seen. There is a displaced fracture of the left posterior-lateral seventh rib, of uncertain age. Mild degenerative spurring noted within the thoracic spine. IMPRESSION: 1. Dense  opacity at the left lung base laterally, confined to the lingula based on the lateral projection, compatible with pneumonia or airspace collapse. Questionable small associated pleural effusion. 2. Significantly displaced fracture of the left posterior-lateral seventh rib, of uncertain age. Electronically Signed   By: Franki Cabot M.D.   On: 06/29/2016 17:46   Ct Angio Chest Pe W/cm &/or Wo Cm  Result Date: 06/29/2016 CLINICAL DATA:  Chest pain, shortness of breath, and flank bruising. EXAM: CT ANGIOGRAPHY CHEST CT ABDOMEN AND PELVIS WITH CONTRAST TECHNIQUE: Multidetector CT imaging of the chest was performed using the standard protocol during bolus administration of intravenous contrast. Multiplanar CT image reconstructions and MIPs were obtained to evaluate the vascular anatomy. Multidetector CT imaging of the abdomen and pelvis was performed using the standard protocol during bolus administration of intravenous contrast. CONTRAST:  100 cc Isovue 370 intravenous COMPARISON:  None. FINDINGS: CTA CHEST FINDINGS Cardiovascular: There is intermittent respiratory motion with pulmonary artery blurring. No acute pulmonary embolism is seen, especially limited on  the right beyond the segmental level. No evidence of acute aortic syndrome. Normal heart size without pericardial effusion. Mild scattered atheromatous calcification of the aorta. Mediastinum: Mediastinal and bilateral hilar adenopathy. Right lower peritracheal lymph node measures 17 mm short axis. No associated cavitation or calcification. Lungs/Pleura: There is a small left pleural effusion with incomplete layering and occasional pleural thickening and enhancement. This is presumably a hemothorax related to displaced left seventh segmental rib fracture with the lateral component 100% displaced. At the neck, there is subtle signs of healing suggesting subacute injury. This correlates with history of symptoms for 1 month. Lingular opacity with volume loss,  likely related atelectasis. Diffuse airway thickening. There is a 8 by 5 mm (7 mm mean diameter) nodule in the right lower lobe 7:76 which could be bronchial lymph node or pulmonary nodule. The same differential for a rounded 7 mm right upper lobe nodule 7:3. Mild centrilobular emphysema. Upper abdomen: See below Musculoskeletal: As above there is segmental fracture of the left seventh rib with displacement. There is edematous change around the left chest wall which is presumably traumatic. A discrete fluid collection deep to the lower latissimus Dorsey measures up to 44 mm and has a small focus of mural calcification, unexpected. CT ABDOMEN and PELVIS FINDINGS Lower chest and abdominal wall:  Fatty right inguinal hernia. Hepatobiliary: Sub cm low-density foci in the liver are likely incidental cysts.No evidence of biliary obstruction or stone. Pancreas: Unremarkable. Spleen: Unremarkable. Adrenals/Urinary Tract: Negative adrenals. Small appearing kidneys with numerous cysts. No hydronephrosis or stone. Unremarkable bladder. Stomach/Bowel:  No obstruction. No inflammatory changes. Reproductive:Prostate enlargement asymmetric to the left, projecting into the bladder base with heterogeneous central gland enhancement that is nonspecific. Vascular/Lymphatic: No acute vascular abnormality. 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 There are mildly enlarged bilateral external iliac lymph nodes measuring up to 12 mm on the left. Mild prominence of deep liver drainage lymph nodes, usually incidental and reactive. Haziness of fat at the root of the small bowel mesentery without adenopathy, sclerosing mesenteritis. Other: No ascites or pneumoperitoneum. Musculoskeletal: Advanced lumbar disc and facet degeneration. Rib fracture described above. No acute osseous findings at the abdomen. Review of the MIP images confirms the above findings. IMPRESSION: 1. Small complex left effusion that is  presumably hemothorax given segmental left seventh rib fracture that is displaced. Lingular atelectasis. Left chest wall swelling correlates with a presumed hematoma/seroma. 2. Hilar/mediastinal adenopathy and two 7 mm right lung nodules. This appearance could be seen with lymphoma, metastasis, or sarcoidosis. Recommend outpatient workup and chest CT in 3 months. 3. Moderately motion degraded study. No evidence of pulmonary embolism. 4. No acute intra-abdominal finding. 5. Prostatomegaly with asymmetric left-sided enlargement. Correlate with PSA. 6. Mild iliac adenopathy bilaterally. Significance to be determined based on workup of #2. 7.  Emphysema. (ICD10-J43.9), mild. 8.  Aortic Atherosclerosis (ICD10-170.0) Electronically Signed   By: Monte Fantasia M.D.   On: 06/29/2016 22:02   Ct Abdomen Pelvis W Contrast  Result Date: 06/29/2016 CLINICAL DATA:  Chest pain, shortness of breath, and flank bruising. EXAM: CT ANGIOGRAPHY CHEST CT ABDOMEN AND PELVIS WITH CONTRAST TECHNIQUE: Multidetector CT imaging of the chest was performed using the standard protocol during bolus administration of intravenous contrast. Multiplanar CT image reconstructions and MIPs were obtained to evaluate the vascular anatomy. Multidetector CT imaging of the abdomen and pelvis was performed using the standard protocol during bolus administration of intravenous contrast. CONTRAST:  100 cc Isovue 370 intravenous COMPARISON:  None. FINDINGS: CTA CHEST FINDINGS Cardiovascular: There is intermittent respiratory motion with pulmonary artery blurring. No acute pulmonary embolism is seen, especially limited on the right beyond the segmental level. No evidence of acute aortic syndrome. Normal heart size without pericardial effusion. Mild scattered atheromatous calcification of the aorta. Mediastinum: Mediastinal and bilateral hilar adenopathy. Right lower peritracheal lymph node measures 17 mm short axis. No associated cavitation or calcification.  Lungs/Pleura: There is a small left pleural effusion with incomplete layering and occasional pleural thickening and enhancement. This is presumably a hemothorax related to displaced left seventh segmental rib fracture with the lateral component 100% displaced. At the neck, there is subtle signs of healing suggesting subacute injury. This correlates with history of symptoms for 1 month. Lingular opacity with volume loss, likely related atelectasis. Diffuse airway thickening. There is a 8 by 5 mm (7 mm mean diameter) nodule in the right lower lobe 7:76 which could be bronchial lymph node or pulmonary nodule. The same differential for a rounded 7 mm right upper lobe nodule 7:3. Mild centrilobular emphysema. Upper abdomen: See below Musculoskeletal: As above there is segmental fracture of the left seventh rib with displacement. There is edematous change around the left chest wall which is presumably traumatic. A discrete fluid collection deep to the lower latissimus Dorsey measures up to 44 mm and has a small focus of mural calcification, unexpected. CT ABDOMEN and PELVIS FINDINGS Lower chest and abdominal wall:  Fatty right inguinal hernia. Hepatobiliary: Sub cm low-density foci in the liver are likely incidental cysts.No evidence of biliary obstruction or stone. Pancreas: Unremarkable. Spleen: Unremarkable. Adrenals/Urinary Tract: Negative adrenals. Small appearing kidneys with numerous cysts. No hydronephrosis or stone. Unremarkable bladder. Stomach/Bowel:  No obstruction. No inflammatory changes. Reproductive:Prostate enlargement asymmetric to the left, projecting into the bladder base with heterogeneous central gland enhancement that is nonspecific. Vascular/Lymphatic: No acute vascular abnormality. 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 There are mildly enlarged bilateral external iliac lymph nodes measuring up to 12 mm on the left. Mild prominence of deep liver drainage lymph  nodes, usually incidental and reactive. Haziness of fat at the root of the small bowel mesentery without adenopathy, sclerosing mesenteritis. Other: No ascites or pneumoperitoneum. Musculoskeletal: Advanced lumbar disc and facet degeneration. Rib fracture described above. No acute osseous findings at the abdomen. Review of the MIP images confirms the above findings. IMPRESSION: 1. Small complex left effusion that is presumably hemothorax given segmental left seventh rib fracture that is displaced. Lingular atelectasis. Left chest wall swelling correlates with a presumed hematoma/seroma. 2. Hilar/mediastinal adenopathy and two 7 mm right lung nodules. This appearance could be seen with lymphoma, metastasis, or sarcoidosis. Recommend outpatient workup and chest CT in 3 months. 3. Moderately motion degraded study. No evidence of pulmonary embolism. 4. No acute intra-abdominal finding. 5. Prostatomegaly with asymmetric left-sided enlargement. Correlate with PSA. 6. Mild iliac adenopathy bilaterally. Significance to be determined based on workup of #2. 7.  Emphysema. (ICD10-J43.9), mild. 8.  Aortic Atherosclerosis (ICD10-170.0) Electronically Signed   By: Monte Fantasia M.D.   On: 06/29/2016 22:02   Dg Chest Port 1 View  Result Date: 06/30/2016 CLINICAL DATA:  Left hemothorax, shortness of breath, recent pneumonia. EXAM: PORTABLE CHEST 1 VIEW COMPARISON:  CT chest and chest radiograph 06/29/2016. FINDINGS: Trachea is midline. Heart size normal. Pleural parenchymal opacification of the lateral base of the left hemi thorax is unchanged. Right lung is clear. IMPRESSION: Pleural parenchymal opacification at the base of the left  hemi thorax corresponds to a small left pleural effusion and adjacent collapse/ consolidation on 06/29/2016. Findings are unchanged. Associated left seventh rib fracture is better visualized on the comparison exam. Electronically Signed   By: Lorin Picket M.D.   On: 06/30/2016 10:28      Assessment:   Mediastinal/hilar/iliac adenopathy of uncertain etiology Left hemithorax. - he has shoulder bruising as well asLeft 7 rib fx- it is unclear why he has no knowledge of trauma.  Low grade leukocytosis ? Some post obstructive uropathy(prostate) , mild ARI- prostate is enlarged on CT scan  Plan:   Recommend follow up CT in 2-3 months to evaluate mediastinal adenopathy if persists consider ebus/ mediastinoscopy Un clear etiology of rib fracture now more then month ago Would recommend Pulmonary consult for treatment of Pneumonia , ? TB    Grace Isaac MD      [address] [place] [address] Office [phone]   [place]

## 2016-07-02 NOTE — Evaluation (Signed)
Occupational Therapy Evaluation Patient Details Name: Martin Cortez MRN: [id number] DOB: 05/16/1941 Today's Date: 07/02/2016    History of Present Illness 75 yo male, recent history of pneumonia in [place] (~May 2017), Left 7th rib fracture with hematoma   Clinical Impression   Pt was independent in ADL and walked with a cane PTA. Currently functioning at a supervision level in showering, dressing, grooming and toileting. Would benefit from a shower seat for safety and energy conservation. No further OT needs.   Follow Up Recommendations  No OT follow up    Equipment Recommendations  Tub/shower seat    Recommendations for Other Services       Precautions / Restrictions Precautions Precautions: Fall Precaution Comments: Patient uses cane at baseline Restrictions Weight Bearing Restrictions: No      Mobility Bed Mobility Overal bed mobility: Independent                Transfers Overall transfer level: Independent Equipment used: Rolling walker (2 wheeled)             General transfer comment: safe technique demonstrated    Balance Overall balance assessment: Needs assistance Sitting-balance support: No upper extremity supported Sitting balance-Leahy Scale: Good     Standing balance support: No upper extremity supported Standing balance-Leahy Scale: Good                              ADL Overall ADL's : Needs assistance/impaired Eating/Feeding: Independent;Sitting   Grooming: Wash/dry hands;Standing;Supervision/safety   Upper Body Bathing: Supervision/ safety;Sitting   Lower Body Bathing: Minimal assistance;Sit to/from stand Lower Body Bathing Details (indicate cue type and reason): recommended long handled bath sponge Upper Body Dressing : Set up;Sitting   Lower Body Dressing: Supervision/safety;Sit to/from stand   Toilet Transfer: Supervision/safety;RW;Ambulation;Comfort height toilet   Toileting- Clothing  Manipulation and Hygiene: Supervision/safety;Sit to/from stand       Functional mobility during ADLs: Supervision/safety;Rolling walker General ADL Comments: Pt could benefit from a shower seat for energy conservation.     Vision     Perception     Praxis      Pertinent Vitals/Pain Pain Assessment: Faces Faces Pain Scale: Hurts even more Pain Location: L side with coughing Pain Descriptors / Indicators: Grimacing;Guarding Pain Intervention(s): Monitored during session (splinted with pillow)     Hand Dominance Right   Extremity/Trunk Assessment Upper Extremity Assessment Upper Extremity Assessment: Overall WFL for tasks assessed   Lower Extremity Assessment Lower Extremity Assessment: Defer to PT evaluation   Cervical / Trunk Assessment Cervical / Trunk Assessment: Normal   Communication Communication Communication: Prefers language other than English (spanish, family interpreted)   Cognition Arousal/Alertness: Awake/alert Behavior During Therapy: WFL for tasks assessed/performed Overall Cognitive Status: Within Functional Limits for tasks assessed                     General Comments       Exercises       Shoulder Instructions      Home Living Family/patient expects to be discharged to:: Private residence Living Arrangements: Children Available Help at Discharge: Family;Available 24 hours/day Type of Home: House       Home Layout: One level     Bathroom Shower/Tub: Teacher, early years/pre: Standard     Home Equipment: Cane - single point          Prior Functioning/Environment Level of Independence: Independent with assistive device(s)  Comments: Uses cane at baseline, sometimes has help to wash feet    OT Diagnosis: Generalized weakness;Acute pain   OT Problem List:     OT Treatment/Interventions:      OT Goals(Current goals can be found in the care plan section) Acute Rehab OT Goals Patient Stated Goal: go  home  OT Frequency:     Barriers to D/C:            Co-evaluation              End of Session Equipment Utilized During Treatment: Rolling walker;Oxygen (02 replaced at end of session)  Activity Tolerance: Patient tolerated treatment well Patient left: in bed;with call bell/phone within reach;with family/visitor present   Time: 1355-1410 OT Time Calculation (min): 15 min Charges:  OT General Charges $OT Visit: 1 Procedure OT Evaluation $OT Eval Moderate Complexity: 1 Procedure G-Codes:    Malka So 07/02/2016, 2:16 PM  [phone]

## 2016-07-02 NOTE — Progress Notes (Signed)
Patient ID: Martin Cortez, male   DOB: [birth year], 75 y.o.   MRN: [id number]   LOS: 3 days   Subjective: No new c/o.   Objective: Vital signs in last 24 hours: Temp:  [98.4 F (36.9 C)-98.8 F (37.1 C)] 98.8 F (37.1 C) (07/24 0523) Pulse Rate:  [83-90] 90 (07/24 0523) Resp:  [16-17] 17 (07/24 0523) BP: (133-138)/(71-75) 138/71 (07/24 0523) SpO2:  [99 %-100 %] 100 % (07/24 0523) Last BM Date: 07/01/16   Physical Exam General appearance: alert and no distress Resp: wheezes bilaterally and right>left Cardio: regular rate and rhythm GI: normal findings: bowel sounds normal and soft, non-tender   Assessment/Plan: Left rib fx w/HTX -- Stable Adenopathy -- Will have TTS consult FEN -- Pain controlled VTE -- SCD's Dispo -- Likely home today after TTS consult unless they plan on workup here.    Lisette Abu, PA-C Pager: [phone] General Trauma PA Pager: [phone]  07/02/2016

## 2016-07-02 NOTE — Progress Notes (Signed)
Physical Therapy Treatment Patient Details Name: Martin Cortez MRN: [id number] DOB: Jun 23, 1941 Today's Date: 07/02/2016    History of Present Illness 75 yo male, recent history of pneumonia in [place] (~May 2017), Left 7th rib fracture with hematoma    PT Comments    Pt making steady progress with mobility during PT sessions. Pt ambulating 175 ft with rw and supervision. Pt reports feeling more stable when using assistive device. Following session, pt denies any questions or concerns. Daughter assisting with communication per pt request. PT to follow and progress activity as tolerated.   Follow Up Recommendations  Home health PT;Supervision for mobility/OOB     Equipment Recommendations  Rolling walker with 5" wheels    Recommendations for Other Services       Precautions / Restrictions Precautions Precautions: Fall Restrictions Weight Bearing Restrictions: No    Mobility  Bed Mobility Overal bed mobility: Independent                Transfers Overall transfer level: Independent               General transfer comment: safe technique demonstrated  Ambulation/Gait Ambulation/Gait assistance: Supervision Ambulation Distance (Feet): 175 Feet Assistive device: Rolling walker (2 wheeled) Gait Pattern/deviations: Step-through pattern Gait velocity: decreased   General Gait Details: slow pace, pt reports feeling better when using rw.    Stairs            Wheelchair Mobility    Modified Rankin (Stroke Patients Only)       Balance Overall balance assessment: Needs assistance Sitting-balance support: No upper extremity supported Sitting balance-Leahy Scale: Good     Standing balance support: No upper extremity supported Standing balance-Leahy Scale: Good                      Cognition Arousal/Alertness: Awake/alert Behavior During Therapy: WFL for tasks assessed/performed Overall Cognitive Status: Within Functional Limits  for tasks assessed                      Exercises      General Comments        Pertinent Vitals/Pain Pain Assessment: Faces Faces Pain Scale: Hurts a little bit Pain Location: chest when coughing Pain Descriptors / Indicators: Sharp Pain Intervention(s): Limited activity within patient's tolerance;Monitored during session    Home Living                      Prior Function            PT Goals (current goals can now be found in the care plan section) Acute Rehab PT Goals Patient Stated Goal: go home PT Goal Formulation: With patient/family Time For Goal Achievement: 07/14/16 Potential to Achieve Goals: Good Progress towards PT goals: Progressing toward goals    Frequency  Min 3X/week    PT Plan Current plan remains appropriate    Co-evaluation             End of Session Equipment Utilized During Treatment: Gait belt Activity Tolerance: Patient tolerated treatment well Patient left: in chair;with call bell/phone within reach;with family/visitor present     Time: 1214-1229 PT Time Calculation (min) (ACUTE ONLY): 15 min  Charges:  $Gait Training: 8-22 mins                    G Codes:      Cassell Clement, PT, CSCS Pager [phone] Office [phone]  [phone]  07/02/2016, 1:10 PM

## 2016-07-02 NOTE — Care Management Important Message (Signed)
Important Message  Patient Details  Name: Martin Cortez MRN: [id number] Date of Birth: [birth year]   Medicare Important Message Given:  Yes    Loann Quill 07/02/2016, 11:09 AM

## 2016-07-03 DIAGNOSIS — S2232XA Fracture of one rib, left side, initial encounter for closed fracture: Secondary | ICD-10-CM | POA: Diagnosis present

## 2016-07-03 DIAGNOSIS — R590 Localized enlarged lymph nodes: Secondary | ICD-10-CM | POA: Diagnosis present

## 2016-07-03 MED ORDER — TRAMADOL HCL 50 MG PO TABS: 50.0000 mg | ORAL_TABLET | Freq: Four times a day (QID) | ORAL | 0 refills | Status: DC | PRN

## 2016-07-03 NOTE — Care Management Note (Signed)
Case Management Note  Patient Details  Name: Martin Cortez MRN: [id number] Date of Birth: January 10, 1941  Subjective/Objective:   Pt admitted on 06/29/16 with Lt rib fracture with associated hemothorax.  PTA, pt resides at home with family.                 Action/Plan: Pt for dc likely 7/25.  PT recommending HH follow up, RW.  Met with pt and sister to discuss arrangements.  Pt agreeable to [facility] follow up; has no preference to [facility] agency.  Referral to [facility] for [facility] and  DME needs.  RW to be delivered to room prior to dc.    Expected Discharge Date:   07/03/16               Expected Discharge Plan:  [place]  In-House Referral:     Discharge planning Services  CM Consult  Post Acute Care Choice:  Durable Medical Equipment, Home Health Choice offered to:  Patient  DME Arranged:  Walker rolling DME Agency:  [place] Arranged:  PT [facility] Agency:  [place]  Status of Service:  Completed, signed off  If discussed at [place] of [place] Meetings, dates discussed:    Additional Comments:  Reinaldo Raddle, RN, BSN  Trauma/Neuro ICU Case Manager [phone]

## 2016-07-03 NOTE — Progress Notes (Signed)
Patient ID: Martin Cortez, male   DOB: 1941/05/21, 75 y.o.   MRN: [id number]   LOS: 4 days   Subjective: No new c/o   Objective: Vital signs in last 24 hours: Temp:  [97.7 F (36.5 C)-98.4 F (36.9 C)] 98.4 F (36.9 C) (07/25 0424) Pulse Rate:  [92-103] 92 (07/25 0424) Resp:  [18] 18 (07/25 0424) BP: (113-141)/(69-74) 113/69 (07/25 0424) SpO2:  [95 %-100 %] 100 % (07/25 0424) Last BM Date: 07/02/16   Physical Exam General appearance: alert and no distress Resp: clear to auscultation bilaterally Cardio: regular rate and rhythm GI: normal findings: bowel sounds normal and soft, non-tender   Assessment/Plan: Left rib fx w/HTX -- Stable Adenopathy -- TTS recommends repeat CT in 3 months, pulmonary consult for possible TB FEN -- Pain controlled VTE -- SCD's Dispo -- Home today with OP pulmonary consult    Lisette Abu, PA-C Pager: [phone] General Trauma PA Pager: [phone]  07/03/2016

## 2016-07-03 NOTE — Discharge Summary (Signed)
Physician Discharge Summary  Patient ID: Martin Cortez MRN: [id number] DOB/AGE: October 20, 1941 75 y.o.  Admit date: 06/29/2016 Discharge date: 07/03/2016  Discharge Diagnoses Patient Active Problem List   Diagnosis Date Noted  . Left rib fracture 07/03/2016  . Hilar lymphadenopathy 07/03/2016  . Hemothorax on left 06/29/2016  . History of colonic polyps   . Diverticulosis of colon without hemorrhage     Consultants Dr. Lanelle Bal for thoracic surgery   Procedures None   HPI: Martin Cortez presented to the [facility] emergency department complaining of a popping sensation on his ribs on the left side as well as intermittent shortness of breath. Workup there revealed a left seventh rib fracture with associated hemothorax. I accepted him in transfer to the trauma service. I spoke with him and his son at length. There is no history of trauma. He was admitted to the hospital in [place] about 6 weeks ago for a severe pneumonia. This rib popping has been bothering him since that time. He additionally complained of some generalized weakness. He denied fevers or night sweats. Incidentally on his CAT scans he was found to have hilar, mediastinal, and iliac adenopathy. He was admitted to the trauma service.   Hospital Course: The patient remained stable while hospitalized. Thoracic surgery was consulted for his lymphadenopathy and recommended follow-up in 3 months with another CT scan. They also recommended a pulmonary evaluation for possible TB as he says he was treated last year. His pain was controlled and he was ambulating independently. He was discharged home in good condition.     Medication List    TAKE these medications   albuterol 108 (90 Base) MCG/ACT inhaler Commonly known as:  PROVENTIL HFA;VENTOLIN HFA Inhale 1-2 puffs into the lungs every 6 (six) hours as needed for wheezing or shortness of breath.   Fluticasone-Salmeterol 250-50 MCG/DOSE Aepb Commonly known as:   ADVAIR Inhale 1 puff into the lungs 2 (two) times daily.   traMADol 50 MG tablet Commonly known as:  ULTRAM Take 1-2 tablets (50-100 mg total) by mouth every 6 (six) hours as needed for moderate pain.       Follow-up Information    Grace Isaac, MD. Schedule an appointment as soon as possible for a visit in 3 month(s).   Specialty:  Cardiothoracic Surgery Contact information: [address] [place] [address] [phone]        [facility]. Schedule an appointment as soon as possible for a visit today.   Specialty:  Pulmonology Why:  To follow up on tuberculosis Contact information: [place] .   Why:  Call as needed Contact information: [address] [id number] [place] [phone]           Signed: Lisette Abu, PA-C Pager: [phone] General Trauma PA Pager: [phone] 07/03/2016, 9:39 AM

## 2016-07-03 NOTE — Progress Notes (Signed)
Pt ready for discharge to home. Pt. Is alert and oriented. Pt is hemodynamically stable. AVS reviewed with pt and daughter. IV removed. Capable of re verbalizing medication regimen. Discharge plan appropriate and in place.

## 2016-07-10 DIAGNOSIS — Z8709 Personal history of other diseases of the respiratory system: Secondary | ICD-10-CM

## 2016-07-10 HISTORY — DX: Personal history of other diseases of the respiratory system: Z87.09

## 2016-07-27 HISTORY — PX: VIDEO ASSISTED THORACOSCOPY (VATS)/EMPYEMA: SHX6172

## 2016-08-03 ENCOUNTER — Inpatient Hospital Stay: Payer: Medicare Other | Admitting: Internal Medicine

## 2016-08-07 HISTORY — PX: THORACOTOMY: SUR1349

## 2016-08-28 ENCOUNTER — Other Ambulatory Visit: Payer: Self-pay | Admitting: *Deleted

## 2016-08-28 DIAGNOSIS — R590 Localized enlarged lymph nodes: Secondary | ICD-10-CM

## 2016-08-28 DIAGNOSIS — J90 Pleural effusion, not elsewhere classified: Secondary | ICD-10-CM

## 2016-09-12 ENCOUNTER — Encounter: Payer: Self-pay | Admitting: Internal Medicine

## 2016-09-27 ENCOUNTER — Ambulatory Visit
Admission: RE | Admit: 2016-09-27 | Discharge: 2016-09-27 | Disposition: A | Discharge status: Home / Self Care | Payer: Medicare Other | Source: Ambulatory Visit | Attending: Cardiothoracic Surgery | Admitting: Cardiothoracic Surgery

## 2016-09-27 ENCOUNTER — Encounter: Payer: Self-pay | Admitting: Cardiothoracic Surgery

## 2016-09-27 ENCOUNTER — Ambulatory Visit (INDEPENDENT_AMBULATORY_CARE_PROVIDER_SITE_OTHER): Payer: Medicare Other | Admitting: Cardiothoracic Surgery

## 2016-09-27 VITALS — BP 152/85 | HR 96 | Resp 16 | Ht 68.0 in | Wt 171.2 lb

## 2016-09-27 DIAGNOSIS — J90 Pleural effusion, not elsewhere classified: Secondary | ICD-10-CM

## 2016-09-27 DIAGNOSIS — R590 Localized enlarged lymph nodes: Secondary | ICD-10-CM

## 2016-09-27 NOTE — Progress Notes (Signed)
[address]       [place] [address]             [id number]                    Martin Cortez [facility] Medical Record [id number] Date of Birth: June 03, 1941  Referring: No ref. provider found Primary Care: No PCP Per Patient  Chief Complaint:    Chief Complaint  Patient presents with  . Follow-up    3 month from hospital consult with CT CHEST for surveillance of mediastinal/hilar adenopathy    History of Present Illness:    Martin Cortez 75 y.o. male is seen in the office  today for As follow-up after presenting to the trauma service in hospital service in July 2017. At that time he had rib fractures on the left. I was consulted to consider mediastinoscopy because of mildly enlarged mediastinal lymph node. 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  In late July the patient was admitted to the trauma service. He was transferred from [facility] where he presented to the emergency department on the date of admission with a popping sensation of his ribs on the left side as well as intermittent shortness of breath. He was found to have a seventh rib fracture with associated hemothorax. He was transferred to [facility] for further management. He has no history of trauma. He was treated a hospital in [place] about 6 weeks ago for severe pneumonia and this sensation of the rib popping began around that time. This was a prolonged hospitalization lasting 3 weeks.  He is also had some generalized weakness. On CT scan he was found to have hilar, mediastinal, and iliac adenopathy. We are asked to consult regarding this finding for further  evaluation diagnostic workup. He denies fevers or chills. He does have some productive yellow sputum production. He denies hemoptysis. He  smoked as as a young adult but quit many years ago. He has no previous pulmonary history but states has had a recent diagnosis of asthma.   Ultimately the patient developed a left-sided empyema and was taken to [facility], see below  "Martin Cortez is a 75 yo gentleman presenting to [facility], as a hospital transfer from [facility] for SOB. 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 Pt reports a 3 month hx of SOB with minor progression over the past month and awoke this morning with L-sided CP. Pt spent the past 6 months at home in [place], of which the last month was spent in a hospital. Pt returned to the [place] and the patient was recently seen in [place], [place] for left-sided rib fracture. and was seen in clinic in [place] and hospitalized, at which point he received multiple doses of adenosine and cardioversion. CT chest with contrast on July 21 revealed a left pleural effusion, lingular atelectasis, left chest wall swelling possibly secondary to a presumed hematoma/seroma, hilar and mediastinal adenopathy, 2 7 mm right lung nodules possibly secondary to lymphoma, metastasis, or sarcoidosis.  Dr. Jovita Kussmaul team initially managed  Martin Cortez and did an extensive diagnostic work-up. They consulted IR for drainage of the Left hemothorax and evaluated the fluid for signs of infection or malignancy. 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 It was then assumed that this was just a post-traumatic hemothorax since he had evidence of recent rib fracture despite not describing much of a traumatic history. The chest tube was placed by IR but drainage quickly slowed even with TPA the  effusion persisted. He was scheduled for a VATS and taken to the OR with Dr. Roselie Awkward on 07/27/16 for bronch and VATS with evacuation of his hemothorax with chest tube placement. This went well and he recovered well from the surgery. However, chest tube output remained ~192mL/day or more for 9 days post-op. Despite this his daily chest x-rays looked relatively stable with stable postoperative loculated effusion. On 08/04/16 he complained again of dyspnea and his chest tube was placed back on suction but with little change in output or x-ray appearance. On 8/28 the chest tube output tapered down and the tube was removed, and then a large amount of pus drained from the tract. A chest CT wo contrast showed residual fluid with air fluid levels, assumed to be an empyema. Plans were made for take back to the OR the following day for decortication. Pan cultures were sent, and he was started on IV antibiotics.   On 8/29 Martin Cortez was taken back to the OR for left vertical thoracotomy (latissimus sparing) with resection of the 6th rib and visceral and parietal pleural decortication of the left upper lobe and left lower lobe. Dr. Roselie Awkward placed 2 (28French) drains and a 19Fr submuscular blake drain. Cultures returned with MRSA. He was started on vanco, and shortly thereafter developed a rash, tachycardia and tachypnea. The infusion was stopped and he was treated with IV benadryl and pepcid. He was then switched to linezolid for antibiotics. PT/OT consults were placed for overall weakness. His tubes were removed appropriately. PT recommended further rehab for strenthening, and SW made arrangements for SNF for completion of antibiotics and therapies. He was ready for discharge on 9/06.    The patient comes to the office today for follow-up CT scan that was arranged at the time of his original admission, he is now recovering from a left thoracotomy. The CT scan does raise the possibility of right lower lobe infection  possibly atypical mycobacterium. Through interpreter the patient denies any fever chills  Current Activity/ Functional Status:  Patient is independent with mobility/ambulation, transfers, ADL's, IADL's.   Zubrod Score: At the time of surgery this patient's most appropriate activity status/level should be described as: []     0    Normal activity, no symptoms []     1    Restricted in physical strenuous activity but ambulatory, able to do out light work []     2    Ambulatory and capable of self care, unable to do work activities, up and about               >50 % of waking hours                              []     3    Only limited self care, in bed greater than 50% of waking hours []     4    Completely disabled, no self care, confined  to bed or chair []     5    Moribund   Past Medical History:  Diagnosis Date  . Arthritis   . Bladder stone   . Sleep apnea    STOP BANG score= 5    Past Surgical History:  Procedure Laterality Date  . COLONOSCOPY N/A 03/08/2016   Procedure: COLONOSCOPY;  Surgeon: Daneil Dolin, MD;  Location: [facility] SUITE;  Service: Endoscopy;  Laterality: N/A;  9:30 AM - interpreter scheduled - do NOT move  . CYSTOSCOPY  2012   [facility], [place]  . CYSTOSCOPY WITH LITHOLAPAXY  06/07/09   [facility], [place], spinal  . CYSTOSCOPY WITH LITHOLAPAXY  02/21/2012   Procedure: CYSTOSCOPY WITH LITHOLAPAXY;  Surgeon: Marissa Nestle, MD;  Location: [facility] ORS;  Service: Urology;  Laterality: N/A;  . UMBILICAL HERNIA REPAIR  01/08/11   incarcerated, [facility] Baseman, [facility]    No family history on file.  Social History   Social History  . Marital status: Married    Spouse name: N/A  . Number of children: N/A  . Years of education: N/A   Occupational History  . Not on file.   Social History Main Topics  . Smoking status: Former Research scientist (life sciences)  . Smokeless tobacco: Not on file  . Alcohol use No  . Drug use: No  . Sexual activity: Not on file   Other Topics Concern  . Not on file   Social  History Narrative  . No narrative on file    History  Smoking Status  . Former Smoker  Smokeless Tobacco  . Not on file    History  Alcohol Use No     No Known Allergies  Current Outpatient Prescriptions  Medication Sig Dispense Refill  . albuterol (PROVENTIL HFA;VENTOLIN HFA) 108 (90 Base) MCG/ACT inhaler Inhale 1-2 puffs into the lungs every 6 (six) hours as needed for wheezing or shortness of breath.    . Fluticasone-Salmeterol (ADVAIR) 250-50 MCG/DOSE AEPB Inhale 1 puff into the lungs 2 (two) times daily.    . traMADol (ULTRAM) 50 MG tablet Take 1-2 tablets (50-100 mg total) by mouth every 6 (six) hours as needed for moderate pain. 50 tablet 0   No current facility-administered medications for this visit.       Review of Systems:     Cardiac Review of Systems: Y or N  Chest Pain [  y  ]  Resting SOB [  n ] Exertional SOB  [ y ]  Orthopnea [ n ]   Pedal Edema [ n  ]    Palpitations [ n ] Syncope  [ n ]   Presyncope [ n  ]  General Review of Systems: [Y] = yes [  ]=no Constitional: recent weight change [  ];  Wt loss over the last 3 months [   ] anorexia [  ]; fatigue [  ]; nausea [  ]; night sweats [  ]; fever [n  ]; or chills [ n ];          Dental: poor dentition[  ]; Last Dentist visit:   Eye : blurred vision [  ]; diplopia [   ]; vision changes [  ];  Amaurosis fugax[  ]; Resp: cough [  ];  wheezing[  ];  hemoptysis[  ]; shortness of breath[  ]; paroxysmal nocturnal dyspnea[  ]; dyspnea on exertion[ n ]; or orthopnea[  ];  GI:  gallstones[  ], vomiting[  ];  dysphagia[  ]; melena[  ];  hematochezia [  ]; heartburn[  ];   Hx of  Colonoscopy[  ]; GU: kidney stones [  ]; hematuria[  ];   dysuria [  ];  nocturia[  ];  history of     obstruction [  ]; urinary frequency [  ]             Skin: rash, swelling[  ];, hair loss[  ];  peripheral edema[  ];  or itching[  ]; Musculosketetal: myalgias[  ];  joint swelling[  ];  joint erythema[  ];  joint pain[  ];  back pain[   ];  Heme/Lymph: bruising[  ];  bleeding[  ];  anemia[  ];  Neuro: TIA[  ];  headaches[  ];  stroke[  ];  vertigo[  ];  seizures[  ];   paresthesias[  ];  difficulty walking[ n ];  Psych:depression[  ]; anxiety[  ];  Endocrine: diabetes[  ];  thyroid dysfunction[  ];  Immunizations: Flu up to date [ n ]; Pneumococcal up to date Fl[place]ncio.Farrier  ];  Other:  Physical Exam: BP (!) 152/85   Pulse 96   Resp 16   Ht 5\' 8"  (1.727 m)   Wt 171 lb 3.2 oz (77.7 kg)   SpO2 97% Comment: ON RA  BMI 26.03 kg/m   PHYSICAL EXAMINATION: General appearance: alert, cooperative and appears older than stated age Head: Normocephalic, without obvious abnormality, atraumatic Neck: no adenopathy, no carotid bruit, no JVD, supple, symmetrical, trachea midline and thyroid not enlarged, symmetric, no tenderness/mass/nodules Lymph nodes: Cervical, supraclavicular, and axillary nodes normal. Resp: diminished breath sounds LLL Back: symmetric, no curvature. ROM normal. No CVA tenderness. Cardio: regular rate and rhythm, S1, S2 normal, no murmur, click, rub or gallop GI: soft, non-tender; bowel sounds normal; no masses,  no organomegaly Extremities: extremities normal, atraumatic, no cyanosis or edema and Homans sign is negative, no sign of DVT Neurologic: Grossly normal  Diagnostic Studies & Laboratory data:     Recent Radiology Findings:   Ct Chest Wo Contrast  Result Date: 09/27/2016 CLINICAL DATA:  Patient with shortness of breath and cough. EXAM: CT CHEST WITHOUT CONTRAST TECHNIQUE: Multidetector CT imaging of the chest was performed following the standard protocol without IV contrast. COMPARISON:  Chest CT 06/29/2016. FINDINGS: Cardiovascular: Normal heart size. No pericardial effusion. Aorta and main pulmonary artery are normal in caliber. Mediastinum/Nodes: Unchanged prominent mildly enlarged lymph nodes including a 1.3 cm right peritracheal lymph node (image 37; series 3). Unchanged 1.0 cm subcarinal lymph node (image  63; series 3). Lungs/Pleura: Central airways are patent. Interval development of multiple nodules within the right lower lobe with a reference nodule measuring 9 mm (image 78; series 4). Subpleural ground-glass and consolidative opacities within the right lower lobe. Re- demonstrated subpleural consolidation within the left upper lobe along the fissure. Slight interval decrease in size of small left pleural effusion. Persistent complex appearing loculated fluid within the lateral left hemi thorax (image 74; series 3). Small punctate foci gas within the left pleural space. Small radiodensity within the left lower hemi thorax (image 163; series 3). Upper Abdomen: Normal adrenal glands. Exophytic 12 mm lesion off the interpolar region of the right kidney, most compatible with a small cyst. Musculoskeletal: Interval development of displaced left lateral sixth rib fracture. Re- demonstrated displaced left lateral seventh rib fracture. Interval increase in soft tissue stranding within the overlying left chest wall. Small radiodensity within the left chest wall between the fifth and sixth ribs (image 52;  series 3). IMPRESSION: Persistent consolidation within the left upper lobe along the fissure. Persistent complex loculated fluid within the lateral left hemi thorax with new overlying displaced left lateral 6 rib fracture. Recommend correlation for interval thoracotomy. Malignancy or atypical infectious process are not excluded. There is a small foci of gas within the left pleural space. Additionally there is a small radiodensity within the left chest wall just deep to the left fifth rib. Surgical clip or foreign body not excluded. Increased soft tissue stranding within the left lateral chest wall which may be postprocedural in etiology. Atypical infectious process or malignancy are not excluded. Slight interval decrease in size of small left pleural effusion. Stable to slight interval decrease in mediastinal adenopathy.  Interval development of extensive nodularity within the right lower lobe which may be secondary to aspiration or infection. Malignancy is not excluded. Recommend short-term follow-up chest CT to ensure resolution. Electronically Signed   By: Lovey Newcomer M.D.   On: 09/27/2016 13:01     I have independently reviewed the above radiologic studies.  Recent Lab Findings: Lab Results  Component Value Date   WBC 14.2 (H) 06/29/2016   HGB 11.0 (L) 06/29/2016   HCT 34.6 (L) 06/29/2016   PLT 288 06/29/2016   GLUCOSE 103 (H) 06/29/2016   ALT 17 06/29/2016   AST 22 06/29/2016   NA 137 06/29/2016   K 4.2 06/29/2016   CL 107 06/29/2016   CREATININE 1.37 (H) 06/29/2016   BUN 18 06/29/2016   CO2 25 06/29/2016   INR 1.07 06/29/2016      Assessment / Plan:  Now the patient is recovering from drainage of left empyema and rib resection on the left  Interval development of extensive nodularity within the right lower lobe which may be secondary to aspiration or infection. Question of history of TB    The patient does not need a operative intervention at this time but does need further pulmonary evaluation for the interval development of nodularity in the right lower lobe possibly atypical mycobacterium. We will make a follow-up appointment for him with pulmonary.   I  spent 40 minutes counseling the patient face to face and 50% or more the  time was spent in counseling and coordination of care. The total time spent in the appointment was 60 minutes.  Grace Isaac MD      [place] [place] [address] Office [phone]   [place] [phone]  09/27/2016 1:23 PM

## 2016-10-01 ENCOUNTER — Encounter: Payer: Self-pay | Admitting: Gastroenterology

## 2016-10-01 ENCOUNTER — Ambulatory Visit (INDEPENDENT_AMBULATORY_CARE_PROVIDER_SITE_OTHER): Payer: Medicare Other | Admitting: Gastroenterology

## 2016-10-01 VITALS — BP 154/90 | HR 90 | Temp 97.7°F | Ht 68.0 in | Wt 169.4 lb

## 2016-10-01 DIAGNOSIS — R195 Other fecal abnormalities: Secondary | ICD-10-CM | POA: Diagnosis not present

## 2016-10-01 DIAGNOSIS — R1013 Epigastric pain: Secondary | ICD-10-CM

## 2016-10-01 DIAGNOSIS — D649 Anemia, unspecified: Secondary | ICD-10-CM | POA: Insufficient documentation

## 2016-10-01 LAB — CBC WITH DIFFERENTIAL/PLATELET
BASOS PCT: 0 %
Basophils Absolute: 0 cells/uL (ref 0–200)
EOS ABS: 505 {cells}/uL — AB (ref 15–500)
Eosinophils Relative: 5 %
HEMATOCRIT: 38.3 % — AB (ref 38.5–50.0)
Hemoglobin: 12.1 g/dL — ABNORMAL LOW (ref 13.2–17.1)
LYMPHS ABS: 2828 {cells}/uL (ref 850–3900)
Lymphocytes Relative: 28 %
MCH: 27 pg (ref 27.0–33.0)
MCHC: 31.6 g/dL — ABNORMAL LOW (ref 32.0–36.0)
MCV: 85.5 fL (ref 80.0–100.0)
MONO ABS: 303 {cells}/uL (ref 200–950)
MPV: 9.5 fL (ref 7.5–12.5)
Monocytes Relative: 3 %
NEUTROS ABS: 6464 {cells}/uL (ref 1500–7800)
Neutrophils Relative %: 64 %
PLATELETS: 269 10*3/uL (ref 140–400)
RBC: 4.48 MIL/uL (ref 4.20–5.80)
RDW: 17.5 % — ABNORMAL HIGH (ref 11.0–15.0)
WBC: 10.1 10*3/uL (ref 3.8–10.8)

## 2016-10-01 MED ORDER — PANTOPRAZOLE SODIUM 40 MG PO TBEC: 40.0000 mg | DELAYED_RELEASE_TABLET | Freq: Every day | ORAL | 3 refills | Status: AC

## 2016-10-01 NOTE — Patient Instructions (Addendum)
1. Please start pantoprazole once daily before breakfast. Your prescription was sent [facility]. 2. Please have your labs done today.  3. Keep appointment Thursday with the lung doctor.  4. Once your results are back, we will make arrangements for upper endoscopy with Dr. Gala Romney.

## 2016-10-01 NOTE — Progress Notes (Signed)
Primary Care Physician:  Barry Dienes, NP  Primary Gastroenterologist:  Garfield Cornea, MD   Chief Complaint  Patient presents with  . Colonoscopy    ? had TCS last year  . Blood In Stools    HPI:  Martin Cortez is a 75 y.o. male here for further evaluation of decline in Hgb, heme + stool at request of PCP.   Patient hospitalized at [facility] for 4 days in July when he presented with popping sensation on his ribs on the left side and intermittent shortness of breath. He was noted to have a left seventh rib fracture with associated hemothorax. No history of trauma. Had been in [place] and proximally 6 weeks prior and admitted for severe pneumonia. Patient states he also had an ulcer at that time. Incidentally CAT scan showed hilar, mediastinal, iliac adenopathy. Also had prostatomegaly with asymmetric left sided enlargement. Seen by thoracic surgery who recommended 3 month follow-up CT scan. The also recommended pulmonary evaluation for possible TB as patient states he was treated the year previously.  Patient admitted to [facility] in [facility] back on 07/20/2016, hospital transfer from [facility] for shortness of breath. Initially left hemothorax was drained via our and evaluated for signs of infection or malignancy and this was negative. Quad Gold TB test was negative. Chest tube was placed by our but drainage quickly slowed and effusion persisted however. Therefore on 07/27/2016 he had bronchoscopy, left VATS and evacuation hemothorax with placement of 28 French Blake drain. Due to persistent drainage and postoperative loculated effusion, large amount of pus noted once tube was removed a chest CT was obtained showing residual fluid with air-fluid levels, assumed to be an empyema. He went back to the OR on 08/07/2016, left vertical thoracotomy, latissimus sparing. Resection of the sixth rib. Full visceral and parietal pleural decortication of the left upper lobe and left lower lobe.  Placement of 20 French Blake drain pleural chest tubes 2, submuscular 19 [place] Blake drain 1. Cultures returned positive for MRSA, he was started on vancomycin. Developed a rash and was switched to linezolid.   Patient required blood transfusion during hospitalization in [place]. On 08/13/2016 his hemoglobin was 8.3, hematocrit 26.3.PSA 0.87.  Patient seen at [facility] in follow-up last week. Patient noted to have interval development of extensive nodularity within the right lower lobe possibly secondary to aspiration or infection. Atypical Mycobacterium not excluded. Referral to pulmonology arranged for this week.   Patient presents to our office at the request of PCP for further evaluation of heme positive stool, anemia,? Repeat colonoscopy. Hemoglobin 8.6 on September 27 per PCP. Iron 28, TIBC 196, iron saturations 10%, MCV 91.3, ferritin 365.4.  Patient presents with interpreter today. He denies any bright red blood per rectum or melena. His main complaint is that of shortness of breath, left sided rib cage pain. He does complain of postprandial epigastric discomfort. Some nausea but no vomiting. Complains of early satiety. Notes it hurts when he takes a deep breath. He reports being told he had an ulcer when he was in [place] last. Complains of fatigue. He was not aware of his pulmonology appointment for this week, we have provided details to him including address and appointment date/time. He should denies BC/Goody's powders use. Denies NSAIDs or aspirin.     Current Outpatient Prescriptions  Medication Sig Dispense Refill  . HYDROcodone-acetaminophen (NORCO/VICODIN) 5-325 MG tablet Take 1 tablet by mouth every 6 (six) hours as needed for moderate pain.    Marland Kitchen  traMADol (ULTRAM) 50 MG tablet Take 1-2 tablets (50-100 mg total) by mouth every 6 (six) hours as needed for moderate pain. 50 tablet 0   No current facility-administered medications for this visit.      Allergies as of 10/01/2016 - Review Complete 10/01/2016  Allergen Reaction Noted  . Vancomycin Rash 10/01/2016    Past Medical History:  Diagnosis Date  . Arthritis   . Bladder stone   . Empyema [facility])    August 2017  . Sleep apnea    STOP BANG score= 5    Past Surgical History:  Procedure Laterality Date  . COLONOSCOPY N/A 03/08/2016   Dr. Gala Romney: 34-5 mm tubular adenomas removed. Diverticulosis. Next colonoscopy planned for March 2020  . CYSTOSCOPY  2012   [facility], [place]  . CYSTOSCOPY WITH LITHOLAPAXY  06/07/09   [facility], [place], spinal  . CYSTOSCOPY WITH LITHOLAPAXY  02/21/2012   Procedure: CYSTOSCOPY WITH LITHOLAPAXY;  Surgeon: Marissa Nestle, MD;  Location: [facility];  Service: Urology;  Laterality: N/A;  . UMBILICAL HERNIA REPAIR  01/08/11   incarcerated, Ger[facility] Baseman, [facility]    Family History  Problem Relation Age of Onset  . Colon cancer Neg Hx     Social History   Social History  . Marital status: Married    Spouse name: N/A  . Number of children: N/A  . Years of education: N/A   Occupational History  . Not on file.   Social History Main Topics  . Smoking status: Former Research scientist (life sciences)  . Smokeless tobacco: Not on file  . Alcohol use No  . Drug use: No  . Sexual activity: Not on file   Other Topics Concern  . Not on file   Social History Narrative  . No narrative on file      ROS:  General: Negative for weight loss, fever, chills, Positive anorexia, fatigue, weakness. Eyes: Negative for vision changes.  ENT: Negative for hoarseness, difficulty swallowing , nasal congestion. CV: Negative for chest pain, angina, palpitations, dyspnea on exertion, peripheral edema.  Respiratory: Negative for dyspnea at rest,  cough, sputum, wheezing.See history of present illness  GI: See history of present illness. GU:  Negative for dysuria, hematuria, urinary incontinence, urinary frequency, nocturnal urination.  MS: Negative for joint pain, low back pain. See history of present  illness  Derm: Negative for rash or itching.  Neuro: Negative for weakness, abnormal sensation, seizure, frequent headaches, memory loss, confusion.  Psych: Negative for anxiety, depression, suicidal ideation, hallucinations.  Endo: Negative for unusual weight change.  Heme: Negative for bruising or bleeding. Allergy: Negative for rash or hives.    Physical Examination:  BP (!) 154/90   Pulse 90   Temp 97.7 F (36.5 C) (Oral)   Ht 5\' 8"  (1.727 m)   Wt 169 lb 6.4 oz (76.8 kg)   BMI 25.76 kg/m    General: Well-nourished, well-developed in no acute distress. Accompanied by interpreter  Head: Normocephalic, atraumatic.   Eyes: Conjunctiva pink, no icterus. Mouth: Oropharyngeal mucosa moist and pink , no lesions erythema or exudate. Neck: Supple without thyromegaly, masses, or lymphadenopathy.  Lungs: Clear to auscultation bilaterally.  Heart: Regular rate and rhythm, no murmurs rubs or gallops.  Abdomen: Bowel sounds are normal,  tender with palpation of left rib cage, mild tenderness in the lower abdomen/epigastrium to deep palpation, nondistended, no hepatosplenomegaly or masses, no abdominal bruits or    hernia , no rebound or guarding.   Rectal: Not performed  Extremities: No lower extremity edema.  No clubbing or deformities.  Neuro: Alert and oriented x 4 , grossly normal neurologically.  Skin: Warm and dry, no rash or jaundice.   Psych: Alert and cooperative, normal mood and affect.  Labs: Lab Results  Component Value Date   WBC 14.2 (H) 06/29/2016   HGB 11.0 (L) 06/29/2016   HCT 34.6 (L) 06/29/2016   MCV 93.0 06/29/2016   PLT 288 06/29/2016   Lab Results  Component Value Date   CREATININE 1.37 (H) 06/29/2016   BUN 18 06/29/2016   NA 137 06/29/2016   K 4.2 06/29/2016   CL 107 06/29/2016   CO2 25 06/29/2016   Lab Results  Component Value Date   ALT 17 06/29/2016   AST 22 06/29/2016   ALKPHOS 91 06/29/2016   BILITOT 0.8 06/29/2016   No results found for:  IRON, TIBC, FERRITIN   Imaging Studies: Ct Chest Wo Contrast  Result Date: 09/27/2016 CLINICAL DATA:  Patient with shortness of breath and cough. EXAM: CT CHEST WITHOUT CONTRAST TECHNIQUE: Multidetector CT imaging of the chest was performed following the standard protocol without IV contrast. COMPARISON:  Chest CT 06/29/2016. FINDINGS: Cardiovascular: Normal heart size. No pericardial effusion. Aorta and main pulmonary artery are normal in caliber. Mediastinum/Nodes: Unchanged prominent mildly enlarged lymph nodes including a 1.3 cm right peritracheal lymph node (image 37; series 3). Unchanged 1.0 cm subcarinal lymph node (image 63; series 3). Lungs/Pleura: Central airways are patent. Interval development of multiple nodules within the right lower lobe with a reference nodule measuring 9 mm (image 78; series 4). Subpleural ground-glass and consolidative opacities within the right lower lobe. Re- demonstrated subpleural consolidation within the left upper lobe along the fissure. Slight interval decrease in size of small left pleural effusion. Persistent complex appearing loculated fluid within the lateral left hemi thorax (image 74; series 3). Small punctate foci gas within the left pleural space. Small radiodensity within the left lower hemi thorax (image 163; series 3). Upper Abdomen: Normal adrenal glands. Exophytic 12 mm lesion off the interpolar region of the right kidney, most compatible with a small cyst. Musculoskeletal: Interval development of displaced left lateral sixth rib fracture. Re- demonstrated displaced left lateral seventh rib fracture. Interval increase in soft tissue stranding within the overlying left chest wall. Small radiodensity within the left chest wall between the fifth and sixth ribs (image 52; series 3). IMPRESSION: Persistent consolidation within the left upper lobe along the fissure. Persistent complex loculated fluid within the lateral left hemi thorax with new overlying  displaced left lateral 6 rib fracture. Recommend correlation for interval thoracotomy. Malignancy or atypical infectious process are not excluded. There is a small foci of gas within the left pleural space. Additionally there is a small radiodensity within the left chest wall just deep to the left fifth rib. Surgical clip or foreign body not excluded. Increased soft tissue stranding within the left lateral chest wall which may be postprocedural in etiology. Atypical infectious process or malignancy are not excluded. Slight interval decrease in size of small left pleural effusion. Stable to slight interval decrease in mediastinal adenopathy. Interval development of extensive nodularity within the right lower lobe which may be secondary to aspiration or infection. Malignancy is not excluded. Recommend short-term follow-up chest CT to ensure resolution. Electronically Signed   By: Lovey Newcomer M.D.   On: 09/27/2016 13:01   IMPRESSION:   1. There is large volume hemothorax on the left side with displaced rib fractures of the left sixth and seventh ribs laterally.  2. Considerable collapse/volume loss of the left lung with minimal aerated lung in the left upper lobe, similar in configuration to the previous radiograph of approximately 8 hours prior. A component of underlying pulmonary injury would be difficult to exclude.  3. There is debris within the left main bronchus.  4. No significant abdominal trauma.   5. See incidental findings, as above.  The above findings were discussed with RN Rocky Morel 8:40 PM, immediately following recommendation of the above findings.  Result Narrative  EXAM: CT of the chest, abdomen, and pelvis with 90 mL Omnipaque 350 IV contrast.  One or more of the following radiation dose techniques were used for this examination: automated exposure control, adjustment of the mA and/or kV according to patient size, use of iterative reconstruction technique.  COMPARISON: Chest  radiograph of 07/20/2016.  RADIATION EXPOSURE HISTORY: Number of prior CTs and nuclear medicine cardiac perfusion examinations performed within the last 12 months (including this examination) on the [facility] PACS = 2.  FINDINGS:   Chest:  There are multiple displaced rib fractures involving ribs 6 and 7. There is an associated large volume hemothorax. There is fluid attenuation/hematoma extending into the left chest wall. There is considerable collapse/volume loss of the left lung with minimal aerated lung noted. Due to the considerable volume loss of the lung, underlying pulmonary injury is not excluded. There is considerable airway narrowing of the tracheobronchial tree in the left side with probable debris in the left main bronchus.  The right lung is clear of an acute process. There is no retrosternal hemorrhage or hematoma. Aorta is intact. Heart is not enlarged. Small pericardial effusion.  Abdomen and pelvis:  There are no acute traumatic findings of the solid organs of the abdomen and pelvis. There is no significant diaphragmatic injury/herniation. There is no clear evidence for hepatic or splenic injury/laceration. No subcapsular hemorrhage or hematoma.  Kidneys appear symmetric and the collecting systems appear intact. No adrenal hematoma.  No significant focal mesenteric stranding to suggest mesenteric injury. No gross bowel wall thickening, pneumatosis, free air, or significant pericolonic fat stranding.  No significant free pelvic fluid. No pelvic mass or adenopathy incidentally identified.  No acute osseous fracture identified.   PERTINENT INCIDENTAL FINDINGS:  - Multiple low density renal cystic lesions, some are too small to accurately characterize. There is a bilobed or septated cyst with intermediate density about the lateral left renal cortex on series 3 image 65 measuring approximately 2.2 cm. - There is a thick walled urinary bladder with mass effect posteriorly  from an enlarged prostate. An underlying bladder mass would be difficult to exclude. Consider correlation with urinalysis. - There is a heterogeneous prostate with focal low attenuation. Correlation with a PSA would be recommended. If markedly elevated, prostate MRI may be considered for assessment of underlying malignancy. - There are several prominent gastrohepatic lymph nodes, nonspecific. Please refer to series 3 image 35 in regards to a 7 mm short axis node.

## 2016-10-01 NOTE — Progress Notes (Signed)
CC'ED TO PCP 

## 2016-10-01 NOTE — Assessment & Plan Note (Signed)
75 year old Hispanic male presents with declining hemoglobin, Hemoccult-positive stool. History quite complicated. Patient resented with rib fracture/hemothorax initially without any traumatic injury. Reported history of pneumonia several weeks prior while in [place]. Four-day hospitalization at [facility]. The following month due to decline in pulmonary status, he was reevaluated in transfer from [facility] to [facility] where he underwent multiple interventions as outlined above. Culture did grow MRSA from empyema. He required blood transfusions during the hospitalization, unclear how many units.  Patient presents with declining hemoglobin, heme positive stools. His colonoscopy is up-to-date, done in March 2017 with 3 polyps removed. He does have some upper GI symptoms including anorexia, postprandial nausea and epigastric discomfort. Consider upper endoscopy is next intervention for evaluation of heme positive stool. We'll recheck his labs today. He has an appointment Thursday with the pulmonologist, will wait to see what their thoughts are regarding abnormal chest CT. Started him on a PPI daily. With the use of interpreter, discussed EGD with regards to risk and benefits and patient is agreeable. Further recommendations to follow.

## 2016-10-04 ENCOUNTER — Institutional Professional Consult (permissible substitution): Payer: Medicare Other | Admitting: Internal Medicine

## 2016-10-10 ENCOUNTER — Institutional Professional Consult (permissible substitution): Payer: Medicare Other | Admitting: Internal Medicine

## 2016-10-15 NOTE — Progress Notes (Signed)
Please let patient know his Hgb is near normal. I see he had to move his appt with Dr. Melvyn Novas.  We need to consider EGD for heme+stools BUT we need to wait until Dr. Melvyn Novas evaluates him. His next appt is not for several weeks.   Let's recheck CBC in four weeks.  Ov in four weeks.

## 2016-10-15 NOTE — Progress Notes (Signed)
Making sure routed to [place].

## 2016-10-18 ENCOUNTER — Encounter: Payer: Self-pay | Admitting: Gastroenterology

## 2016-10-22 ENCOUNTER — Other Ambulatory Visit: Payer: Self-pay | Admitting: Gastroenterology

## 2016-10-22 DIAGNOSIS — D649 Anemia, unspecified: Secondary | ICD-10-CM

## 2016-10-23 ENCOUNTER — Other Ambulatory Visit: Payer: Self-pay

## 2016-10-23 DIAGNOSIS — D649 Anemia, unspecified: Secondary | ICD-10-CM

## 2016-10-24 ENCOUNTER — Ambulatory Visit: Payer: Medicare Other | Admitting: Urology

## 2016-11-02 ENCOUNTER — Ambulatory Visit (INDEPENDENT_AMBULATORY_CARE_PROVIDER_SITE_OTHER): Payer: Medicare Other | Admitting: Internal Medicine

## 2016-11-02 ENCOUNTER — Encounter: Payer: Self-pay | Admitting: Internal Medicine

## 2016-11-02 VITALS — BP 140/90 | HR 72 | Ht 68.0 in | Wt 185.0 lb

## 2016-11-02 DIAGNOSIS — R0602 Shortness of breath: Secondary | ICD-10-CM | POA: Insufficient documentation

## 2016-11-02 DIAGNOSIS — R918 Other nonspecific abnormal finding of lung field: Secondary | ICD-10-CM

## 2016-11-02 DIAGNOSIS — J449 Chronic obstructive pulmonary disease, unspecified: Secondary | ICD-10-CM | POA: Diagnosis not present

## 2016-11-02 MED ORDER — BUDESONIDE-FORMOTEROL FUMARATE 80-4.5 MCG/ACT IN AERO: 2.0000 | INHALATION_SPRAY | Freq: Two times a day (BID) | RESPIRATORY_TRACT | 11 refills | Status: DC

## 2016-11-02 MED ORDER — BUDESONIDE-FORMOTEROL FUMARATE 80-4.5 MCG/ACT IN AERO: 2.0000 | INHALATION_SPRAY | Freq: Two times a day (BID) | RESPIRATORY_TRACT | 11 refills | Status: AC

## 2016-11-02 NOTE — Patient Instructions (Addendum)
Stop advair and try symbicort 80 Take 2 puffs first thing in am and then another 2 puffs about 12 hours later.   Work on inhaler technique:  relax and gently blow all the way out then take a nice smooth deep breath back in, triggering the inhaler at same time you start breathing in.  Hold for up to 5 seconds if you can. Blow out thru nose. Rinse and gargle with water when done     Try Pantoprazole (protonix) 40 mg   Take  30-60 min before first meal of the day and Pepcid (famotidine)  20 mg one @  bedtime until return to office - this is the best way to tell whether stomach acid is contributing to your problem.     GERD (REFLUX)  is an extremely common cause of respiratory symptoms just like yours , many times with no obvious heartburn at all.    It can be treated with medication, but also with lifestyle changes including elevation of the head of your bed (ideally with 6 inch  bed blocks),  Smoking cessation, avoidance of late meals, excessive alcohol, and avoid fatty foods, chocolate, peppermint, colas, red wine, and acidic juices such as orange juice.  NO MINT OR MENTHOL PRODUCTS SO NO COUGH DROPS   USE SUGARLESS CANDY INSTEAD (Jolley ranchers or Stover's or Life Savers) or even ice chips will also do - the key is to swallow to prevent all throat clearing. NO OIL BASED VITAMINS - use powdered substitutes.    Please schedule a follow up office visit in 4 weeks, sooner if needed to See Dr Lake Bells re possible repeat  Bronchoscopy    Detener advair y probar symbicort 83 Tome 2 inhalaciones a primera hora de la maana y luego otras 2 inhalaciones aproximadamente 12 horas despus.  Trabaja en la tcnica del inhalador: reljate y sopla suavemente hacia arriba y vuelve a tomar una respiracin suave y profunda, activando el inhalador al mismo tiempo que comienzas a Advice worker. Mantenga por hasta 5 segundos si puede. Soplar a travs de Mudlogger. Enjuague y haga grgaras con agua cuando haya  terminado   Trate Pantoprazol (Protonix) 40 mg Tome Volver Anterior Siguiente harina de 30-60 minutos del da y Pepcid (famotidina) 20 mg uno fuera hasta el regreso a la oficina @ hora de dormir - esta es la mejor manera de saber neu cido del estmago est contribuyendo a su problema.   GERD (REFLUX) es una causa extremadamente comn de depresin respiratoria como la suya, muchas veces sin ninguna acidez obvia en absoluto.  Cesacin, evitacin de las comidas tardas, exceso de alcohol y [employer] grasos, chocolate, menta, colas , vino tinto y jugos cidos como el jugo de [place]. NO HAY MENTA O PRODUCTOS MENTOL QUE NO TENGAN GOTAS DE TOS Caramelos sin azcar en lugar de Risk manager (Jolley ganaderos heno Life Savers de [place] o) o trozos de hielo th?m c?ng har - la clave es de tragar para evitar todas las carraspeo. SIN VITAMINAS A BASE DE ACEITE: use sustitutos en polvo.   Programe una visita a la oficina en 4 semanas, antes si es necesario para ver la posible broncoscopia de la Dra. [place]

## 2016-11-02 NOTE — Progress Notes (Signed)
Subjective:     Patient ID: Martin Cortez, male   DOB: May 15, 1941, 75 y.o.   MRN: [id number]  HPI   75 yo [place] male quit smoking 2014 referred to pulmonary clinic 11/02/2016 by Dr  Ceasar Mons for cough > sob/ abnormal CT chest s/p extensive w/u in [place] for same (see Dr Synthia Innocent note from 09/27/16)     11/02/2016 [facility]st [facility] office visit/ Tanyika Barros   Chief Complaint  Patient presents with  . Pulmonary Consult    Referred by Dr. Servando Snare. Pt's family member reports he has had a lung infection and "liquid in his lung". Pt c/o SOB "all the time". He also c/o cough- prod occ with clear sputum.    did not yet use  advair on day of ov  Cc can't sleep due to coughing with clear mucus/ no fever No better cough  on advair and sob at rest but not reproducible with exertion in office on day of ov   No obvious day to day or daytime variability or assoc purulent sputum or mucus plugs or hemoptysis or cp or chest tightness, subjective wheeze or overt sinus or hb symptoms. No unusual exp hx or h/o childhood pna/ asthma or knowledge of premature birth.  Sleeping ok without nocturnal  or early am exacerbation  of respiratory  c/o's or need for noct saba. Also denies any obvious fluctuation of symptoms with weather or environmental changes or other aggravating or alleviating factors except as outlined above   Current Medications, Allergies, Complete Past Medical History, Past Surgical History, Family History, and Social History were reviewed in [employer] record.  ROS  The following are not active complaints unless bolded sore throat, dysphagia, dental problems, itching, sneezing,  nasal congestion or excess/ purulent secretions, ear ache,   fever, chills, sweats, unintended wt loss, classically pleuritic or exertional cp,  orthopnea pnd or leg swelling, presyncope, palpitations, abdominal pain, anorexia, nausea, vomiting, diarrhea  or change in bowel or bladder  habits, change in stools or urine, dysuria,hematuria,  rash, arthralgias, visual complaints, headache, numbness, weakness or ataxia or problems with walking or coordination,  change in mood/affect or memory.          Review of Systems     Objective:   Physical Exam     depressed appearing amb hispanic male nad - initially let his daughter do all the talking and refused to engage with me in any way then asked my nurse in [place] if she was married during his 3 lap walk = 149ft each s apparent sob   Wt Readings from Last 3 Encounters:  11/02/16 185 lb (83.9 kg)  10/01/16 169 lb 6.4 oz (76.8 kg)  09/27/16 171 lb 3.2 oz (77.7 kg)    Vital signs reviewed - Note on arrival 02 sats  98% on RA     HEENT: nl dentition, turbinates, and oropharynx. Nl external ear canals without cough reflex   NECK :  without JVD/Nodes/TM/ nl carotid upstrokes bilaterally   LUNGS: no acc muscle use,  Nl contour chest with insp and exp rhonchi and exp cough    CV:  RRR  no s3 or murmur or increase in P2, no edema   ABD:  Tensely obese/ soft and nontender with nl inspiratory excursion in the supine position. No bruits or organomegaly, bowel sounds nl  MS:  Nl gait/ ext warm without deformities, calf tenderness, cyanosis or clubbing No obvious joint restrictions   SKIN: warm and dry  without lesions    NEURO:  alert, approp, nl sensorium with  no motor deficits     I personally reviewed images and agree with radiology impression as follows:  CTs contrast Chest  09/27/16 Persistent consolidation within the left upper lobe along the fissure. Persistent complex loculated fluid within the lateral left hemi thorax with new overlying displaced left lateral 6 rib fracture. Recommend correlation for interval thoracotomy. Malignancy or atypical infectious process are not excluded.  There is a small foci of gas within the left pleural space. Additionally there is a small radiodensity within the left  chest wall just deep to the left fifth rib. Surgical clip or foreign body not excluded.  Increased soft tissue stranding within the left lateral chest wall which may be postprocedural in etiology. Atypical infectious process or malignancy are not excluded.  Slight interval decrease in size of small left pleural effusion.  Stable to slight interval decrease in mediastinal adenopathy.  Interval development of extensive nodularity within the right lower lobe which may be secondary to aspiration or infection. Malignancy is not excluded. Recommend short-term follow-up chest CT to ensure resolution.    cxr rec 11/02/2016 but department closed for holidays    Assessment:

## 2016-11-04 DIAGNOSIS — R918 Other nonspecific abnormal finding of lung field: Secondary | ICD-10-CM | POA: Insufficient documentation

## 2016-11-04 DIAGNOSIS — J449 Chronic obstructive pulmonary disease, unspecified: Secondary | ICD-10-CM | POA: Insufficient documentation

## 2016-11-04 NOTE — Assessment & Plan Note (Signed)
11/02/2016  Walked RA x 3 laps @ 185 ft each stopped due to  End of study, nl pace, no sob or desat     Unable to reproduce this chronic complaint in the office - see copd

## 2016-11-04 NOTE — Assessment & Plan Note (Addendum)
S/p extensive w/u in [place] [facility] Summer of 2017 in Care Everywhere:   to the OR with Dr. Roselie Awkward on 07/27/16 for bronch and VATS with evacuation of his hemothorax with chest tube placement. This went well and he recovered well from the surgery. However, chest tube output remained ~155mL/day or more for 9 days post-op. Despite this his daily chest x-rays looked relatively stable with stable postoperative loculated effusion. On 08/04/16 he complained again of dyspnea and his chest tube was placed back on suction but with little change in output or x-ray appearance. On 8/28 the chest tube output tapered down and the tube was removed, and then a large amount of pus drained from the tract. A chest CT wo contrast showed residual fluid with air fluid levels, assumed to be an empyema. Plans were made for take back to the OR the following day for decortication. Pan cultures were sent, and he was started on IV antibiotics.   On 8/29 Martin Cortez was taken back to the OR for left vertical thoracotomy (latissimus sparing) with resection of the 6th rib and visceral and parietal pleural decortication of the left upper lobe and left lower lobe. Dr. Roselie Awkward placed 2 (28French) drains and a 19Fr submuscular blake drain. Cultures returned with MRSA. He was started on vanco, and shortly thereafter developed a rash, tachycardia and tachypnea. The infusion was stopped and he was treated with IV benadryl and pepcid. He was then switched to linezolid for antibiotics. PT/OT consults were placed for overall weakness. His tubes were removed appropriately. PT recommended further rehab for strenthening, and SW made arrangements for SNF for completion of antibiotics and therapies. He was ready for discharge on 08/15/16.    - See CT chest 09/27/16 with Stable to slight interval decrease in mediastinal adenopathy.  Interval development of extensive nodularity within the right lower lobe which may be secondary to aspiration or infection  At  this point we may just be seeing the sequelae of previous ALI related to surgery for ? empyerma on L side but in absence of purulent sputum or fever/wt loss  and with TB being ruled out he may not need FOB at this point but if he does I would like him to see Dr Lake Bells who speaks spanish to work out the logistics.  Needs cxr on return as closed for holiday today

## 2016-11-04 NOTE — Assessment & Plan Note (Addendum)
Spirometry 11/02/2016  FEV1 1.64 (55%)  Ratio 59     Symptoms are markedly disproportionate to objective findings and not clear this is a lung problem but pt does appear to have difficult airway management issues. DDX of  difficult airways management almost all start with A and  include Adherence, Ace Inhibitors, Acid Reflux, Active Sinus Disease, Alpha 1 Antitripsin deficiency, Anxiety masquerading as Airways dz,  ABPA,  Allergy(esp in young), Aspiration (esp in elderly), Adverse effects of meds,  Active smokers, A bunch of PE's (a small clot burden can't cause this syndrome unless there is already severe underlying pulm or vascular dz with poor reserve) plus two Bs  = Bronchiectasis and Beta blocker use..and one C= CHF   Adherence is always the initial "prime suspect" and is a multilayered concern that requires a "trust but verify" approach in every patient - starting with knowing how to use medications, especially inhalers, correctly, keeping up with refills and understanding the fundamental difference between maintenance and prns vs those medications only taken for a very short course and then stopped and not refilled.  - major challenge communicating,  He does not speak english or read spanish and the smallest details takes multiple questions thru fm to sort out ? When did you quit smoking" required 4 attempts. - - The proper method of use, as well as anticipated side effects, of a metered-dose inhaler are discussed and demonstrated to the patient. Improved effectiveness after extensive coaching during this visit to a level of approximately 75 % from a baseline of 25 %   > try symbicort 80 2bid (the lower dose since cough > doe here)   ? Acid (or non-acid) GERD > always difficult to exclude as up to 75% of pts in some series report no assoc GI/ Heartburn symptoms> rec max (24h)  acid suppression and diet restrictions/ reviewed and instructions given in writing.   ? Adverse effects of meds, esp mdi >  d/c advair    ? Anxiety/ depression > dx of exclusion   Total time devoted to counseling  = 35/62m review case with pt/ discussion of options/alternatives/ personally creating written instructions  in presence of pt  then going over those specific  Instructions directly with the pt including how to use all of the meds but in particular covering each new medication in detail and the difference between the maintenance/automatic meds and the prns using an action plan format for the latter.

## 2016-11-13 LAB — CBC WITH DIFFERENTIAL/PLATELET
BASOS ABS: 0 {cells}/uL (ref 0–200)
Basophils Relative: 0 %
EOS ABS: 303 {cells}/uL (ref 15–500)
Eosinophils Relative: 3 %
HEMATOCRIT: 39.9 % (ref 38.5–50.0)
Hemoglobin: 12.5 g/dL — ABNORMAL LOW (ref 13.2–17.1)
LYMPHS PCT: 19 %
Lymphs Abs: 1919 cells/uL (ref 850–3900)
MCH: 27.6 pg (ref 27.0–33.0)
MCHC: 31.3 g/dL — AB (ref 32.0–36.0)
MCV: 88.1 fL (ref 80.0–100.0)
MONO ABS: 707 {cells}/uL (ref 200–950)
MONOS PCT: 7 %
MPV: 9.6 fL (ref 7.5–12.5)
NEUTROS PCT: 71 %
Neutro Abs: 7171 cells/uL (ref 1500–7800)
PLATELETS: 239 10*3/uL (ref 140–400)
RBC: 4.53 MIL/uL (ref 4.20–5.80)
RDW: 16.7 % — AB (ref 11.0–15.0)
WBC: 10.1 10*3/uL (ref 3.8–10.8)

## 2016-11-15 ENCOUNTER — Encounter: Payer: Self-pay | Admitting: Gastroenterology

## 2016-11-15 ENCOUNTER — Ambulatory Visit (INDEPENDENT_AMBULATORY_CARE_PROVIDER_SITE_OTHER): Payer: Medicare Other | Admitting: Gastroenterology

## 2016-11-15 VITALS — BP 162/88 | HR 71 | Temp 98.2°F | Ht 67.0 in | Wt 176.0 lb

## 2016-11-15 DIAGNOSIS — N475 Adhesions of prepuce and glans penis: Secondary | ICD-10-CM

## 2016-11-15 DIAGNOSIS — R1013 Epigastric pain: Secondary | ICD-10-CM

## 2016-11-15 DIAGNOSIS — R6881 Early satiety: Secondary | ICD-10-CM

## 2016-11-15 DIAGNOSIS — R195 Other fecal abnormalities: Secondary | ICD-10-CM | POA: Diagnosis not present

## 2016-11-15 DIAGNOSIS — D649 Anemia, unspecified: Secondary | ICD-10-CM | POA: Diagnosis not present

## 2016-11-15 DIAGNOSIS — N4289 Other specified disorders of prostate: Secondary | ICD-10-CM | POA: Insufficient documentation

## 2016-11-15 NOTE — Assessment & Plan Note (Signed)
Abnormal prostate, CT July 2017. Patient also complains of urinary issues, describes foreskin adhesions. Would like to see a urologist which is reasonable. Will make referral to [facility] in [place].

## 2016-11-15 NOTE — Assessment & Plan Note (Signed)
75 year old Hispanic male with history of decline in hemoglobin, heme positive stool. Recent improvement in hemoglobin since her initial visit for this. He continues to complain of early satiety, epigastric pain especially with meals. He has required blood transfusions during hospitalizations for pulmonary issues including empyema. Several surgical procedures performed as previously outlined. Patient reports history of peptic ulcer disease while in [place] within the last year per his report. Offered him an upper endoscopy for further evaluation in the near future. Continue PPI therapy for now.  I have discussed the risks, alternatives, benefits with regards to but not limited to the risk of reaction to medication, bleeding, infection, perforation and the patient is agreeable to proceed. Written consent to be obtained.

## 2016-11-15 NOTE — Progress Notes (Signed)
Primary Care Physician:  Barry Dienes, NP  Primary Gastroenterologist:  Garfield Cornea, MD   Chief Complaint  Patient presents with  . Blood In Stools    heme positive    HPI:  Martin Cortez is a 75 y.o. [place] male, who presents with interpreter today for follow-up. History of declining hemoglobin, heme positive stool. Seen back in 10/01/2016. Patient has required blood transfusion during previous hospitalization in September. Full details of most recent significant history provided below. Continues to be followed closely by pulmonary with recent visit, see Epic. Plans for follow-up in one month with Dr. Lake Bells for possible bronchoscopy.  When I saw him last month he had evidence hemoglobin of 8, iron 28, TIBC 196, iron saturation 10%, MCV 91.3, ferritin 365. His colonoscopy is up-to-date, done in March 2017 with 3 polyps removed.  Pertinent recent PMH: Patient hospitalized at [facility] for 4 days in July when he presented with popping sensation on his ribs on the left side and intermittent shortness of breath. He was noted to have a left seventh rib fracture with associated hemothorax. No history of trauma. Had been in [place] and proximally 6 weeks prior and admitted for severe pneumonia. Patient states he also had an ulcer at that time. Incidentally CAT scan showed hilar, mediastinal, iliac adenopathy. Also had prostatomegaly with asymmetric left sided enlargement. Seen by thoracic surgery who recommended 3 month follow-up CT scan. The also recommended pulmonary evaluation for possible TB as patient states he was treated the year previously.  Patient admitted to [facility] in [facility] back on 07/20/2016, hospital transfer from [facility] for shortness of breath. Initially left hemothorax was drained via our and evaluated for signs of infection or malignancy and this was negative. Quad Gold TB test was negative. Chest tube was placed by our but drainage quickly slowed and effusion  persisted however. Therefore on 07/27/2016 he had bronchoscopy, left VATS and evacuation hemothorax with placement of 28 French Blake drain. Due to persistent drainage and postoperative loculated effusion, large amount of pus noted once tube was removed a chest CT was obtained showing residual fluid with air-fluid levels, assumed to be an empyema. He went back to the OR on 08/07/2016, left vertical thoracotomy, latissimus sparing. Resection of the sixth rib. Full visceral and parietal pleural decortication of the left upper lobe and left lower lobe. Placement of 20 French Blake drain pleural chest tubes 2, submuscular 19 [place] Blake drain 1. Cultures returned positive for MRSA, he was started on vancomycin. Developed a rash and was switched to linezolid.   Patient complains of postprandial fullness, early satiety. Makes it feel like hard to take a deep breath. Uses inhaler with some relief. Denies dysphagia or heartburn. No melena or rectal bleeding. Has a bowel movement most days. States that he has issues passing his urine. Describes adhesions of the foreskin. Also with prior history of asymmetrical prostate gland on CT scan in July 2017. No PSA on file. We'll make arrangements for him to see [facility].    Current Outpatient Prescriptions  Medication Sig Dispense Refill  . budesonide-formoterol (SYMBICORT) 80-4.5 MCG/ACT inhaler Inhale 2 puffs into the lungs 2 (two) times daily. 1 Inhaler 11  . metoprolol succinate (TOPROL-XL) 25 MG 24 hr tablet Take 25 mg by mouth daily.    . naproxen (NAPROSYN) 500 MG tablet Take 500 mg by mouth daily.    . pantoprazole (PROTONIX) 40 MG tablet Take 1 tablet (40 mg total) by mouth daily before breakfast. 30 tablet 3  .  simvastatin (ZOCOR) 40 MG tablet Take 40 mg by mouth daily.    . traMADol (ULTRAM) 50 MG tablet Take 50 mg by mouth every 6 (six) hours as needed.     No current facility-administered medications for this visit.     Allergies as of  11/15/2016 - Review Complete 11/15/2016  Allergen Reaction Noted  . Vancomycin Rash 10/01/2016    Past Medical History:  Diagnosis Date  . Arthritis   . Bladder stone   . Empyema [facility])    August 2017  . Sleep apnea    STOP BANG score= 5    Past Surgical History:  Procedure Laterality Date  . COLONOSCOPY N/A 03/08/2016   Dr. Gala Romney: 34-5 mm tubular adenomas removed. Diverticulosis. Next colonoscopy planned for March 2020  . CYSTOSCOPY  2012   [facility], [place]  . CYSTOSCOPY WITH LITHOLAPAXY  06/07/09   [facility], [place], spinal  . CYSTOSCOPY WITH LITHOLAPAXY  02/21/2012   Procedure: CYSTOSCOPY WITH LITHOLAPAXY;  Surgeon: Marissa Nestle, MD;  Location: [facility];  Service: Urology;  Laterality: N/A;  . THORACOTOMY  08/07/2016   [place]: Left vertical thoracotomy, latissimus sparing, resection of the sixth rib, full visceral and parietal pleural decortication of the left upper lobe and left lower lobe. Placement of 20 French Blake drain pleural chest tubes 2,, submuscular 19 F blake drain x 1. MRSA.   [facility] UMBILICAL HERNIA REPAIR  01/08/11   incarcerated, Geroge Baseman, [facility]  . VIDEO ASSISTED THORACOSCOPY (VATS)/EMPYEMA  07/27/2016   [place]: Bronchoscopy, left VATS and evacuation hemothorax with placement of 28 French Blake drain.    Family History  Problem Relation Age of Onset  . Colon cancer Neg Hx     Social History   Social History  . Marital status: Married    Spouse name: N/A  . Number of children: N/A  . Years of education: N/A   Occupational History  . Not on file.   Social History Main Topics  . Smoking status: Former Research scientist (life sciences)  . Smokeless tobacco: Never Used     Comment: pt states unsure when he quit smoking  . Alcohol use No  . Drug use: No  . Sexual activity: Not on file   Other Topics Concern  . Not on file   Social History Narrative  . No narrative on file      ROS:  General: Negative for anorexia, weight loss, fever, chills, fatigue, weakness. Eyes: Negative for  vision changes.  ENT: Negative for hoarseness, difficulty swallowing , nasal congestion. CV: Negative for chest pain, angina, palpitations, dyspnea on exertion, peripheral edema.  Respiratory: Negative for dyspnea at rest, dyspnea on exertion, sputum, wheezing. Positive cough. Pain from broken ribs on the left. GI: See history of present illness. GU:  Negative for dysuria, hematuria, urinary incontinence, urinary frequency, nocturnal urination. Difficult urination, nowhere for urine to go due to foreskin adhesions. MS: Negative for joint pain, low back pain.  Derm: Negative for rash or itching.  Neuro: Negative for weakness, abnormal sensation, seizure, frequent headaches, memory loss, confusion.  Psych: Negative for anxiety, depression, suicidal ideation, hallucinations.  Endo: Negative for unusual weight change.  Heme: Negative for bruising or bleeding. Allergy: Negative for rash or hives.    Physical Examination:  BP (!) 162/88   Pulse 71   Temp 98.2 F (36.8 C) (Oral)   Ht 5\' 7"  (1.702 m)   Wt 176 lb (79.8 kg)   BMI 27.57 kg/m    General: Well-nourished, well-developed in no acute distress.  Presents with interpreter today. Head: Normocephalic, atraumatic.   Eyes: Conjunctiva pink, no icterus. Mouth: Oropharyngeal mucosa moist and pink , no lesions erythema or exudate. Neck: Supple without thyromegaly, masses, or lymphadenopathy.  Lungs: Clear to auscultation bilaterally.  Heart: Regular rate and rhythm, no murmurs rubs or gallops.  Abdomen: Bowel sounds are normal, moderate epigastric tenderness, nondistended, no hepatosplenomegaly or masses, no abdominal bruits or    hernia , no rebound or guarding.   Rectal: Not performed Extremities: No lower extremity edema. No clubbing or deformities.  Neuro: Alert and oriented x 4 , grossly normal neurologically.  Skin: Warm and dry, no rash or jaundice.   Psych: Alert and cooperative, normal mood and affect.  Labs: Lab Results   Component Value Date   WBC 10.1 11/12/2016   HGB 12.5 (L) 11/12/2016   HCT 39.9 11/12/2016   MCV 88.1 11/12/2016   PLT 239 11/12/2016   No results found for: IRON, TIBC, FERRITIN   Imaging Studies: No results found.

## 2016-11-15 NOTE — Patient Instructions (Signed)
1. Upper endoscopy as scheduled. See separate instructions. 2. Referral to [facility] regarding abnormality of prostate on CT scan and issues with urination/foreskin.

## 2016-11-18 NOTE — Progress Notes (Signed)
Continued improvement of Hgb. Hct normal.  egd as planned.  Patient aware of results, discussed at [place].  Please repeat CBC in 3 months.

## 2016-11-19 ENCOUNTER — Other Ambulatory Visit: Payer: Self-pay

## 2016-11-19 DIAGNOSIS — R1013 Epigastric pain: Secondary | ICD-10-CM

## 2016-11-19 DIAGNOSIS — R9389 Abnormal findings on diagnostic imaging of other specified body structures: Secondary | ICD-10-CM

## 2016-11-19 DIAGNOSIS — D649 Anemia, unspecified: Secondary | ICD-10-CM

## 2016-11-19 NOTE — Progress Notes (Signed)
cc'd to pcp 

## 2016-11-19 NOTE — Progress Notes (Signed)
Lab order on file for March 2018.

## 2016-12-06 ENCOUNTER — Ambulatory Visit: Payer: Medicare Other | Admitting: Pulmonary Disease

## 2016-12-14 ENCOUNTER — Encounter (HOSPITAL_COMMUNITY): Admission: RE | Payer: Self-pay | Source: Ambulatory Visit

## 2016-12-14 ENCOUNTER — Ambulatory Visit (HOSPITAL_COMMUNITY): Admission: RE | Admit: 2016-12-14 | Payer: Medicare Other | Source: Ambulatory Visit | Admitting: Internal Medicine

## 2016-12-14 SURGERY — EGD (ESOPHAGOGASTRODUODENOSCOPY)
Anesthesia: Moderate Sedation

## 2016-12-17 ENCOUNTER — Telehealth: Payer: Self-pay

## 2016-12-17 NOTE — Telephone Encounter (Signed)
[facility] to follow-up on referral. [facility] called and spoke to pt's daughter. They are currently awaiting pt to call their office to schedule an appointment.

## 2016-12-20 NOTE — Telephone Encounter (Signed)
[facility] to follow up on referral for abnormal prostate on CT. Receptionist advised that pt had an appt 10/24/16 but did not show. Referral from our office was done 11/19/16. Scheduled appt for 02/01/17 at 1:15 pm at [address] [facility] office with Dr. Jeffie Pollock. They will mail pt a packet. Called and informed pt's daughter. She stated he had an appt there recently. Asked her if appt had been since 11/19/16 when we referred him, she replied yes. I called [facility] again and receptionist advised he has not recently had an appt and last appt was 10/24/16 which he was a no show. Tried to call pt's number back. LMOVM and informed daughter that [facility] says they have not seen him recently and [facility] wanted him to see Urologist. Informed of appt date and time, gave phone number to [facility].

## 2017-01-21 ENCOUNTER — Other Ambulatory Visit: Payer: Self-pay

## 2017-01-21 DIAGNOSIS — D649 Anemia, unspecified: Secondary | ICD-10-CM

## 2017-02-01 ENCOUNTER — Ambulatory Visit: Payer: Medicare Other | Admitting: Urology

## 2017-02-13 IMAGING — DX DG CHEST 2V
2 series · 2 of 2 positions shown · non-contrast
Comparison: 06/30/2016

CLINICAL DATA: Pneumonia, cough, shortness of breath, left rib
fracture

EXAM:
CHEST  2 VIEW

[chest pa]
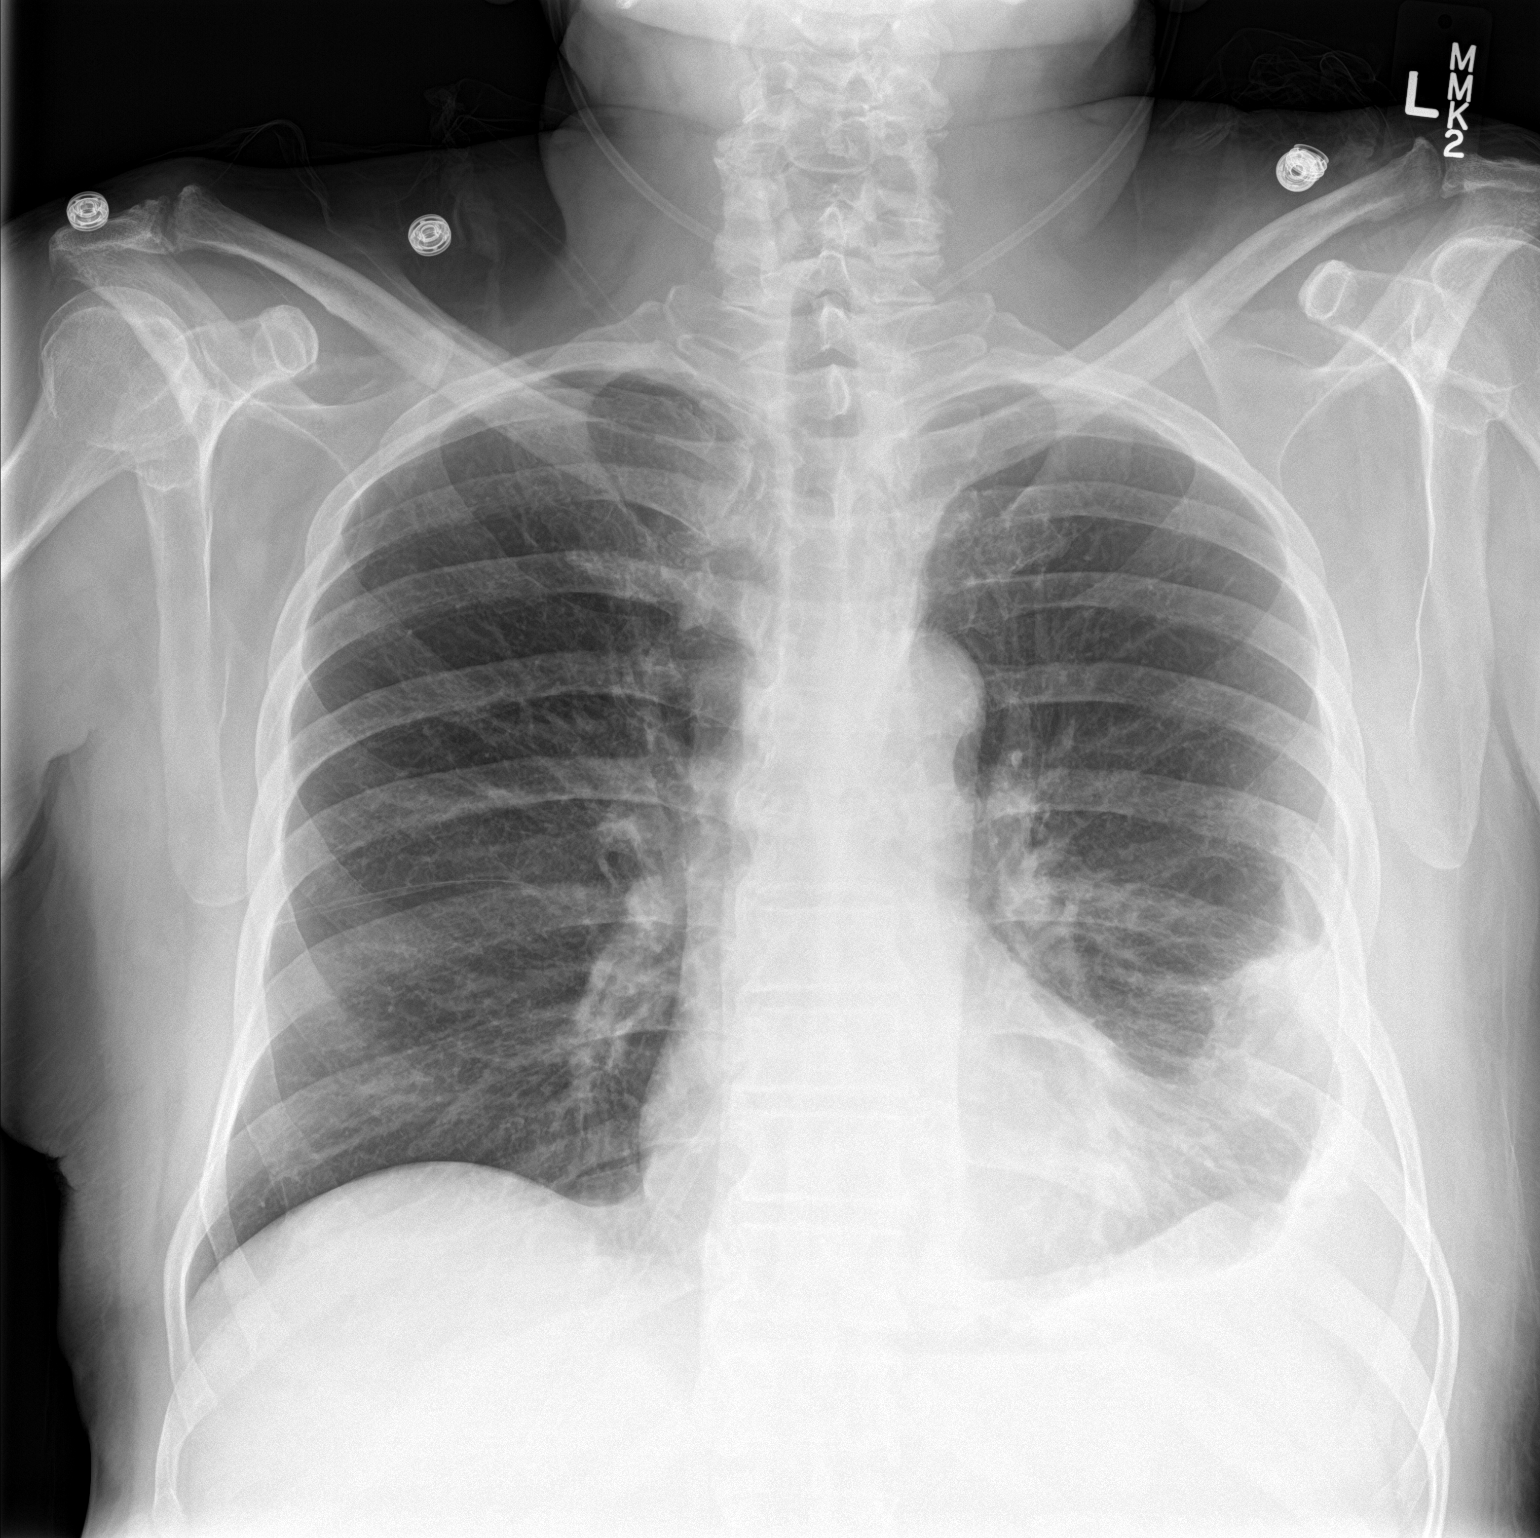

[chest lat]
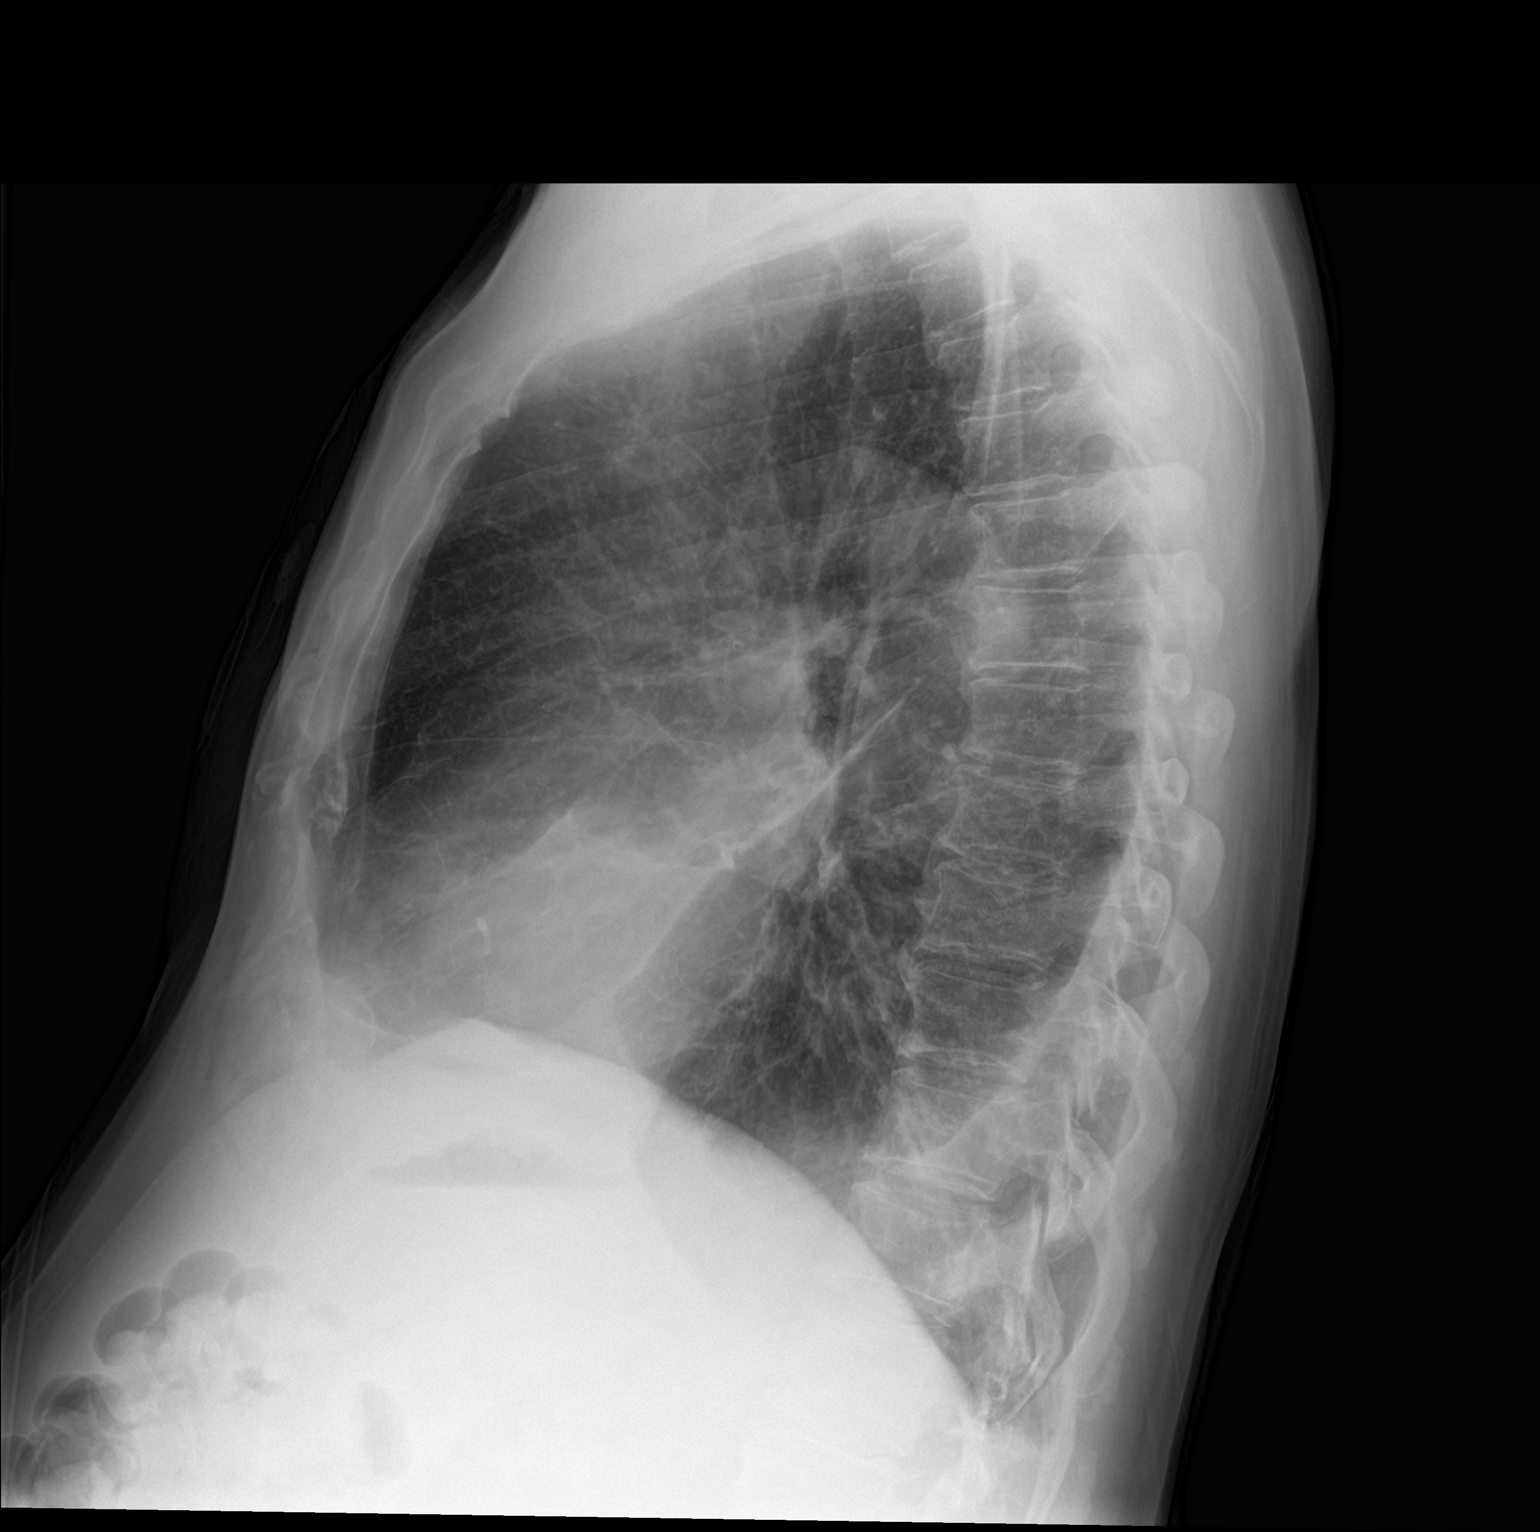

[2 of 2 positions shown; findings below may reference images not displayed]

FINDINGS: Small left pleural effusion, unchanged.

Right lung is clear.  No pneumothorax.

The heart is normal in size.

Nondisplaced left posterolateral 7th rib fracture.
IMPRESSION: Small left pleural effusion, unchanged.

Nondisplaced left posterolateral 7th rib fracture.

## 2017-05-12 IMAGING — CT CT CHEST W/O CM
2 of 4 series · 15 of 30 positions shown, 18 images · non-contrast
Comparison: Chest CT 06/29/2016.

CLINICAL DATA: Patient with shortness of breath and cough.

EXAM:
CT CHEST WITHOUT CONTRAST
TECHNIQUE: Multidetector CT imaging of the chest was performed following the
standard protocol without IV contrast.

[Series 3: chest w/o · axial · non-contrast · 0.70mm/px · z∈[-285,-37]mm · 9 of 216 slices shown, 12 images]
[im 22/216  mediastinal]
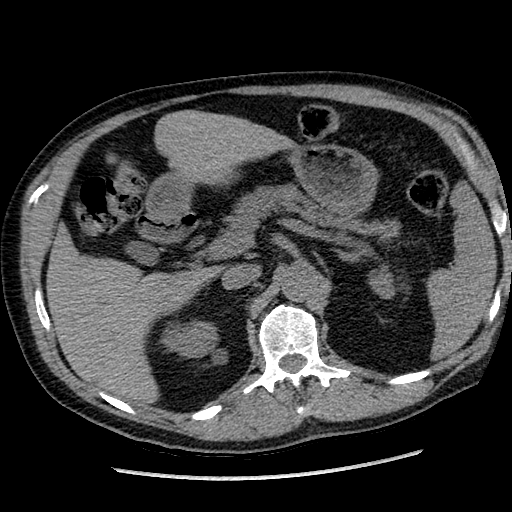
[im 22/216  lung]
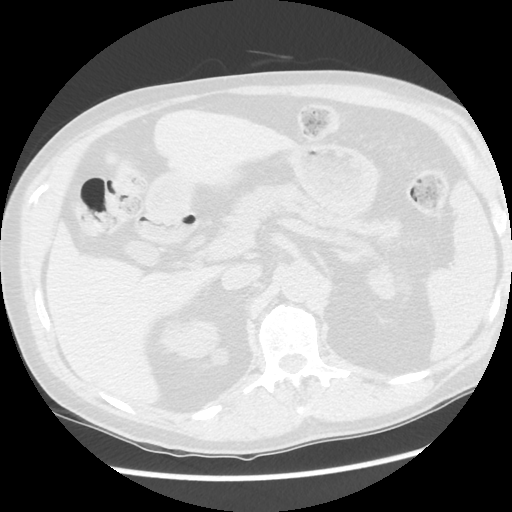
[im 44/216  lung]
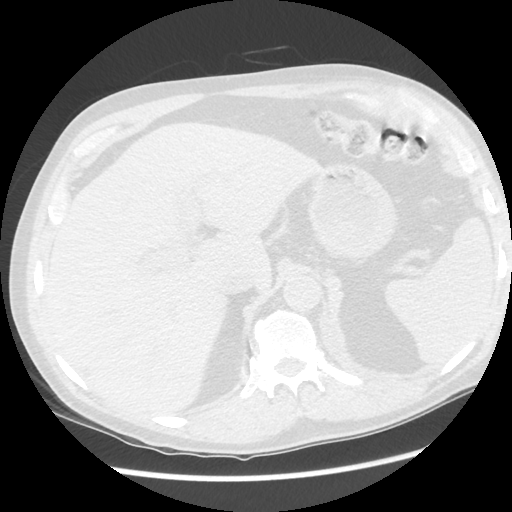
[im 65/216  lung]
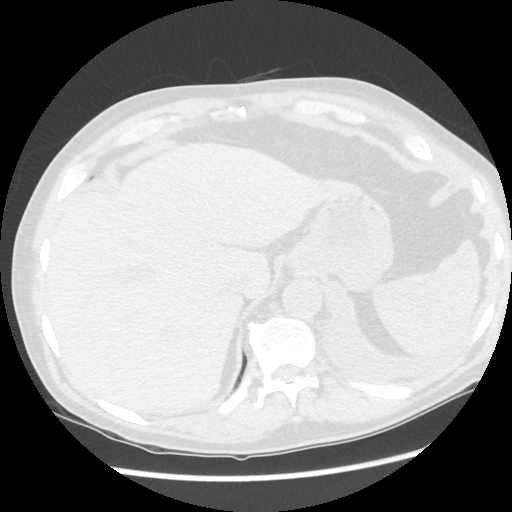
[im 87/216  lung]
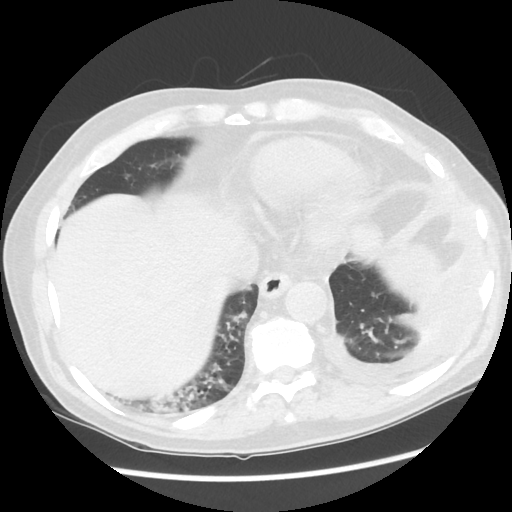
[im 108/216  mediastinal]
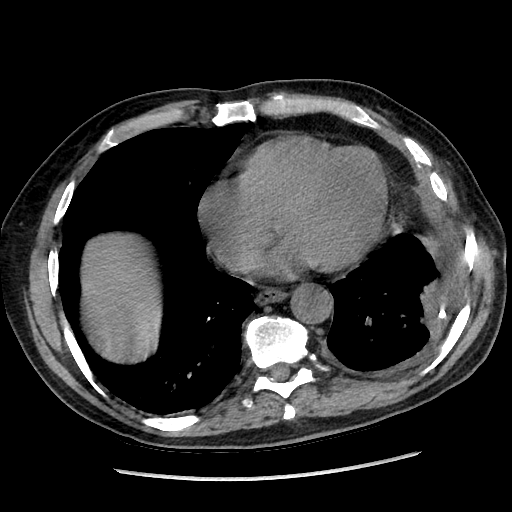
[im 108/216  lung]
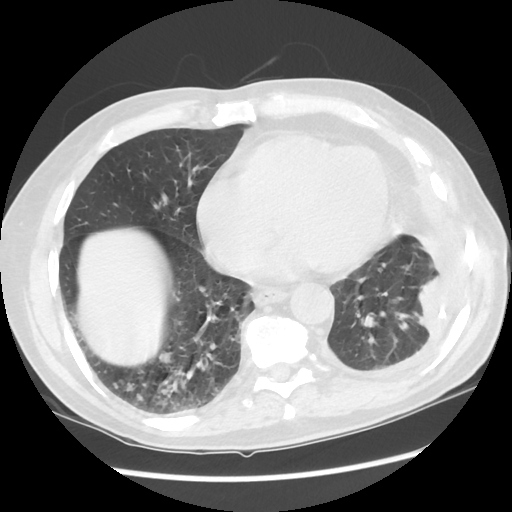
[im 130/216  lung]
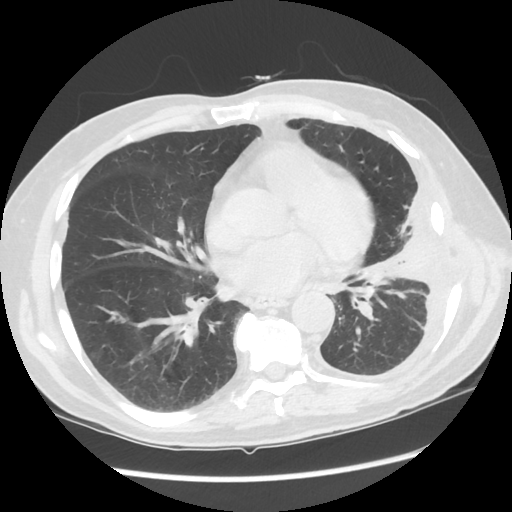
[im 151/216  lung]
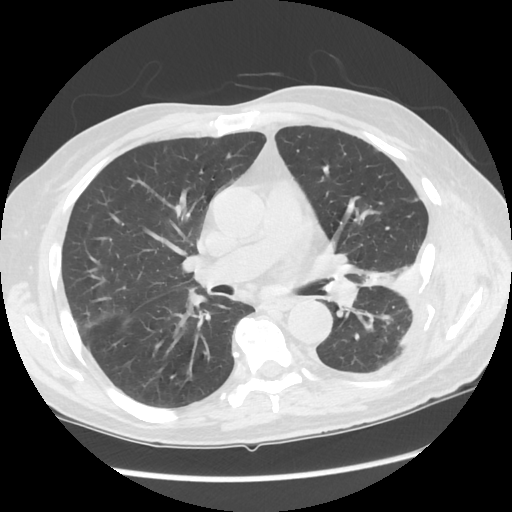
[im 173/216  lung]
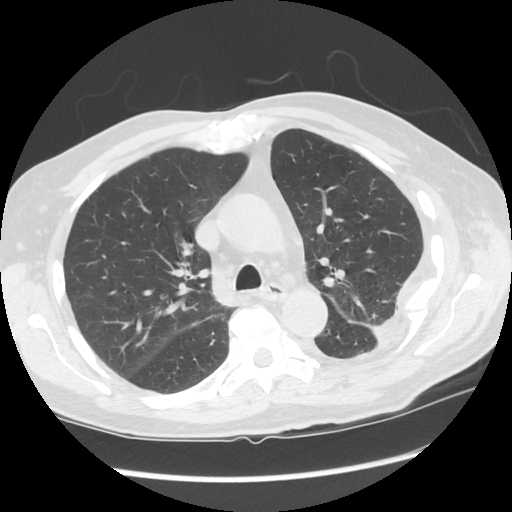
[im 194/216  mediastinal]
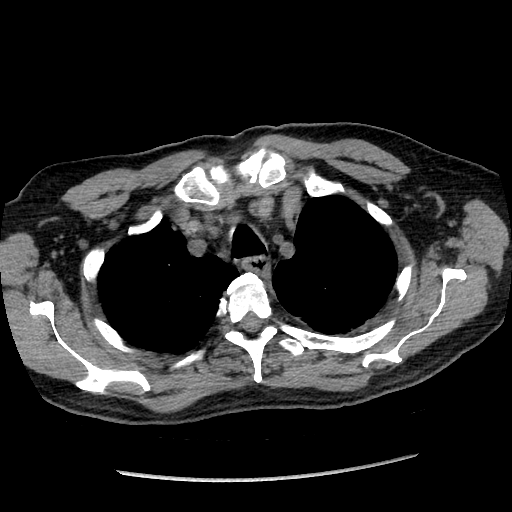
[im 194/216  lung]
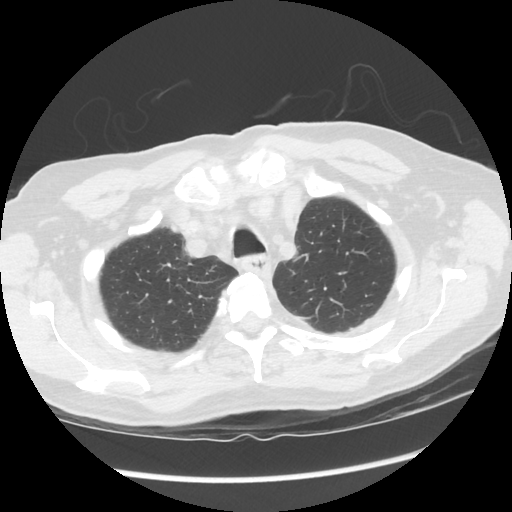

[Series 4: lung windows · axial · 0.70mm/px · z∈[-282,-132]mm · 6 of 216 slices shown]
[im 24/216  lung]
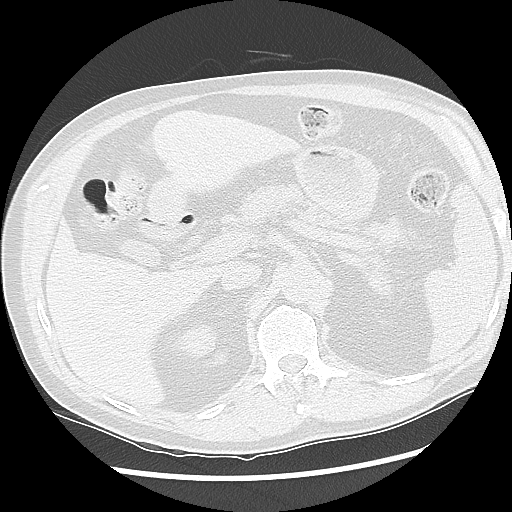
[im 48/216  lung]
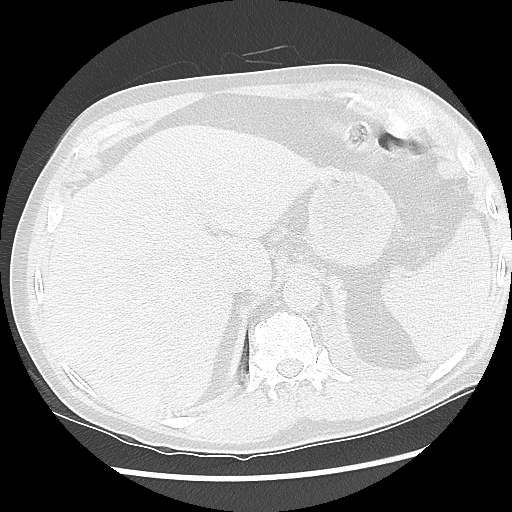
[im 72/216  lung]
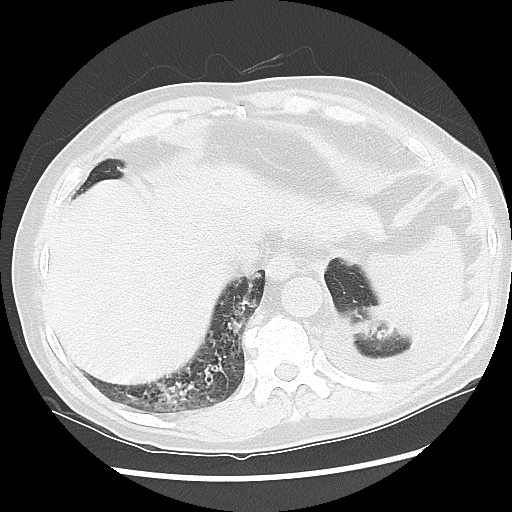
[im 96/216  lung]
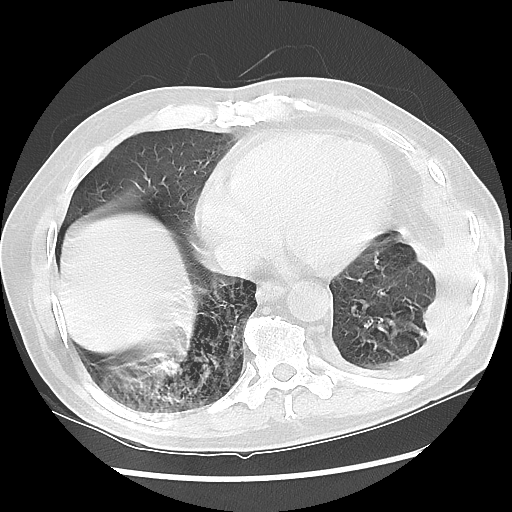
[im 120/216  lung]
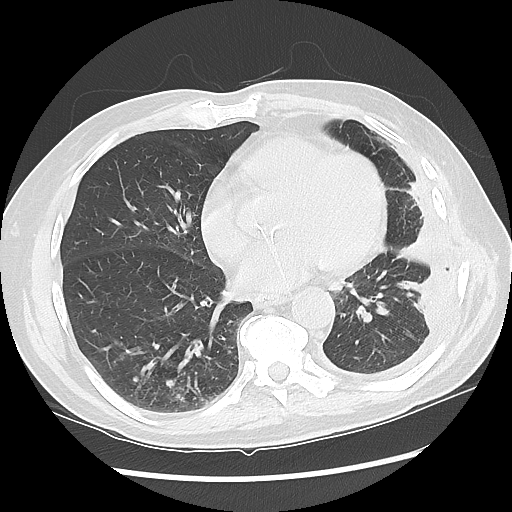
[im 144/216  lung]
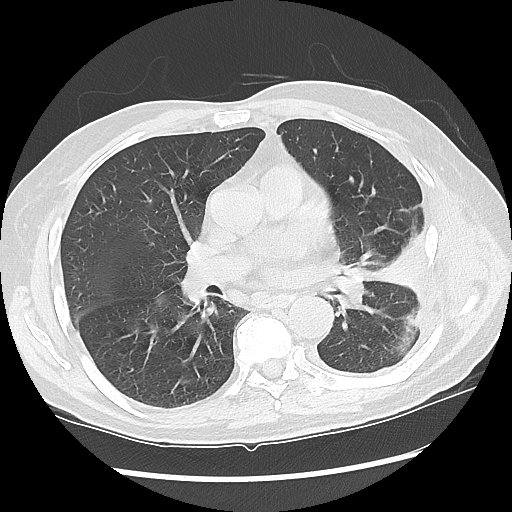

[15 of 30 positions shown; findings below may reference images not displayed]

FINDINGS: Cardiovascular: Normal heart size. No pericardial effusion. Aorta
and main pulmonary artery are normal in caliber.

Mediastinum/Nodes: Unchanged prominent mildly enlarged lymph nodes
including a 1.3 cm right peritracheal lymph node (image 37; series
3). Unchanged 1.0 cm subcarinal lymph node (image 63; series 3).

Lungs/Pleura: Central airways are patent. Interval development of
multiple nodules within the right lower lobe with a reference nodule
measuring 9 mm (image 78; series 4). Subpleural ground-glass and
consolidative opacities within the right lower lobe. Re-
demonstrated subpleural consolidation within the left upper lobe
along the fissure. Slight interval decrease in size of small left
pleural effusion. Persistent complex appearing loculated fluid
within the lateral left hemi thorax (image 74; series 3). Small
punctate foci gas within the left pleural space. Small radiodensity
within the left lower hemi thorax (image 163; series 3).

Upper Abdomen: Normal adrenal glands. Exophytic 12 mm lesion off the
interpolar region of the right kidney, most compatible with a small
cyst.

Musculoskeletal: Interval development of displaced left lateral
sixth rib fracture. Re- demonstrated displaced left lateral seventh
rib fracture. Interval increase in soft tissue stranding within the
overlying left chest wall. Small radiodensity within the left chest
wall between the fifth and sixth ribs (image 52; series 3).
IMPRESSION: Persistent consolidation within the left upper lobe along the
fissure. Persistent complex loculated fluid within the lateral left
hemi thorax with new overlying displaced left lateral 6 rib
fracture. Recommend correlation for interval thoracotomy. Malignancy
or atypical infectious process are not excluded.

There is a small foci of gas within the left pleural space.
Additionally there is a small radiodensity within the left chest
wall just deep to the left fifth rib. Surgical clip or foreign body
not excluded.

Increased soft tissue stranding within the left lateral chest wall
which may be postprocedural in etiology. Atypical infectious process
or malignancy are not excluded.

Slight interval decrease in size of small left pleural effusion.

Stable to slight interval decrease in mediastinal adenopathy.

Interval development of extensive nodularity within the right lower
lobe which may be secondary to aspiration or infection. Malignancy
is not excluded. Recommend short-term follow-up chest CT to ensure
resolution.

## 2017-07-25 ENCOUNTER — Other Ambulatory Visit: Payer: Self-pay | Admitting: Urology

## 2017-07-26 ENCOUNTER — Encounter (HOSPITAL_BASED_OUTPATIENT_CLINIC_OR_DEPARTMENT_OTHER): Payer: Self-pay | Admitting: *Deleted

## 2017-07-30 ENCOUNTER — Encounter (HOSPITAL_BASED_OUTPATIENT_CLINIC_OR_DEPARTMENT_OTHER): Payer: Self-pay | Admitting: *Deleted

## 2017-07-30 NOTE — Progress Notes (Signed)
SPOKE W/ PT'S DAUGHTER, MARIA, WHOM INTERPRETED FOR PT.  DAUGHTER VERBALIZED UNDERSTANDING NPO AFTER MN.  ARRIVE AT 0830.  NEEDS ISTAT AND EKG.  CURRENT CHEST CT IN CHART AND EPIC.  WILL TAKE AM MEDS AND INHALER DOS W/ SIPS OF WATER. REQUESTED SPANISH INTERPRETER DOS, PER PT REQUEST,  TO ARRIVE AT [id number].

## 2017-08-05 NOTE — H&P (Signed)
CC: I have trouble rolling my foreskin back.  HPI: Martin Cortez is a 76 year-old male patient who was referred by Dr. Bridgette Habermann, MD who is here for trouble with rollling back his foreskin.  He has had difficulties with rolling his foreskin back for 8 months.   [name] is a 76 yo hispanic male who is sent by Dr. Gala Cortez for phimosis. He is unable to retract the foreskin and has pain when he tries to retract it. He has no voiding complaints.      ALLERGIES: None   MEDICATIONS: Metoprolol Succinate 50 mg tablet, extended release 24 hr  Simvastatin 40 mg tablet  Allergy Medication  Protonix 40 mg tablet, delayed release     GU PSH: None   NON-GU PSH: Lung Surgery (Unspecified) Remove Kidney Stone    GU PMH: None   NON-GU PMH: Asthma COPD GERD Hypercholesterolemia Hypertension Tuberculosis of lung    FAMILY HISTORY: Family History Unknown     Notes: 4 sons; 2 daughters   SOCIAL HISTORY: Marital Status: Married Preferred Language: Spanish; Castilian; Ethnicity: ; Race: Other Race Current Smoking Status: Patient has never smoked.   Tobacco Use Assessment Completed: Used Tobacco in last 30 days? Does not use smokeless tobacco. Has never drank.  Does not drink caffeine.    REVIEW OF SYSTEMS:    GU Review Male:   Patient reports frequent urination, leakage of urine, trouble starting your stream, and penile pain. Patient denies hard to postpone urination, burning/ pain with urination, get up at night to urinate, stream starts and stops, have to strain to urinate , and erection problems.  Gastrointestinal (Upper):   Patient denies nausea, vomiting, and indigestion/ heartburn.  Gastrointestinal (Lower):   Patient denies diarrhea and constipation.  Constitutional:   Patient denies fever, night sweats, weight loss, and fatigue.  Skin:   Patient reports skin rash/ lesion and itching.   Eyes:   Patient denies blurred vision and double vision.  Ears/ Nose/ Throat:    Patient denies sore throat and sinus problems.  Hematologic/Lymphatic:   Patient denies swollen glands and easy bruising.  Cardiovascular:   Patient denies leg swelling and chest pains.  Respiratory:   Patient reports cough. Patient denies shortness of breath.  Endocrine:   Patient denies excessive thirst.  Musculoskeletal:   Patient denies back pain and joint pain.  Neurological:   Patient denies headaches and dizziness.  Psychologic:   Patient denies depression and anxiety.   VITAL SIGNS:      07/24/2017 11:46 AM  Weight 180 lb / 81.65 kg  Height 66.25 in / 168.28 cm  BP 128/81 mmHg  Pulse 64 /min  Temperature 97.1 F / 36.1 C  BMI 28.8 kg/m   GU PHYSICAL EXAMINATION:    Scrotum: No lesions. No edema. No cysts. No warts.  Epididymides: Right: no spermatocele, no masses, no cysts, no tenderness, no induration, no enlargement. Left: no spermatocele, no masses, no cysts, no tenderness, no induration, no enlargement.  Testes: No tenderness, no swelling, no enlargement left testes. No tenderness, no swelling, no enlargement right testes. Normal location left testes. Normal location right testes. No mass, no cyst, no varicocele, no hydrocele left testes. No mass, no cyst, no varicocele, no hydrocele right testes.  Urethral Meatus: Normal size. No lesion, no wart, no discharge, no polyp. Normal location.  Penis: Penis uncircumcised, phimosis severe.    MULTI-SYSTEM PHYSICAL EXAMINATION:    Constitutional: Well-nourished. No physical deformities. Normally developed. Good grooming.  Neck: Neck symmetrical, not swollen. Normal tracheal position.  Respiratory: No labored breathing, no use of accessory muscles. CTA  Cardiovascular: Normal temperature, RRR without murmur  Lymphatic: No enlargement, no tenderness of groin lymph nodes.  Skin: No paleness, no jaundice, no cyanosis. No lesion, no ulcer, no rash.  Neurologic / Psychiatric: Oriented to time, oriented to place, oriented to person. No  depression, no anxiety, no agitation.  Gastrointestinal: Obese abdomen. Right inguinal hernia. , no tenderness, no rigidity.   Musculoskeletal: Normal gait and station of head and neck.     PAST DATA REVIEWED:  Source Of History:  Patient  Records Review:   Previous Hospital Records  Urine Test Review:   Urinalysis  Notes:                     I have reviewed his records in Care Everywhere.    PROCEDURES:          Urinalysis w/Scope - 81001 Dipstick Dipstick Cont'd Micro  Color: Yellow Bilirubin: Neg WBC/hpf: NS (Not Seen)  Appearance: Clear Ketones: Neg RBC/hpf: 0 - 2/hpf  Specific Gravity: 1.025 Blood: 1+ Bacteria: NS (Not Seen)  pH: 6.0 Protein: 3+ Cystals: NS (Not Seen)  Glucose: Neg Urobilinogen: 0.2 Casts: NS (Not Seen)    Nitrites: Neg Trichomonas: Not Present    Leukocyte Esterase: Neg Mucous: Not Present      Epithelial Cells: NS (Not Seen)      Yeast: NS (Not Seen)      Sperm: Not Present    Notes:      ASSESSMENT:      ICD-10 Details  1 GU:   Phimosis - N47.1 He has severe phimosis and needs a circumcision. I reviewed the risks of bleeding, infection, penile injury and scarring, sexual side effects and anesthetic complications.   2   Unil Inguinal Hernia W/O obst or gang,recurrent - K40.91 He has a small RIH but no symptoms.    PLAN:           Schedule Return Visit/Planned Activity: Next Available Appointment - Schedule Surgery

## 2017-08-06 ENCOUNTER — Ambulatory Visit (HOSPITAL_BASED_OUTPATIENT_CLINIC_OR_DEPARTMENT_OTHER)
Admission: RE | Admit: 2017-08-06 | Discharge: 2017-08-06 | Disposition: A | Discharge status: Home / Self Care | Payer: Medicare Other | Source: Ambulatory Visit | Attending: Urology | Admitting: Urology

## 2017-08-06 ENCOUNTER — Ambulatory Visit (HOSPITAL_BASED_OUTPATIENT_CLINIC_OR_DEPARTMENT_OTHER): Payer: Medicare Other | Admitting: Anesthesiology

## 2017-08-06 ENCOUNTER — Encounter (HOSPITAL_BASED_OUTPATIENT_CLINIC_OR_DEPARTMENT_OTHER)
Admission: RE | Disposition: A | Discharge status: Home / Self Care | Payer: Self-pay | Source: Ambulatory Visit | Attending: Urology

## 2017-08-06 ENCOUNTER — Encounter (HOSPITAL_BASED_OUTPATIENT_CLINIC_OR_DEPARTMENT_OTHER): Payer: Self-pay | Admitting: *Deleted

## 2017-08-06 DIAGNOSIS — N471 Phimosis: Secondary | ICD-10-CM | POA: Diagnosis not present

## 2017-08-06 DIAGNOSIS — I10 Essential (primary) hypertension: Secondary | ICD-10-CM | POA: Diagnosis not present

## 2017-08-06 DIAGNOSIS — J449 Chronic obstructive pulmonary disease, unspecified: Secondary | ICD-10-CM | POA: Insufficient documentation

## 2017-08-06 DIAGNOSIS — Z87891 Personal history of nicotine dependence: Secondary | ICD-10-CM | POA: Insufficient documentation

## 2017-08-06 DIAGNOSIS — K4090 Unilateral inguinal hernia, without obstruction or gangrene, not specified as recurrent: Secondary | ICD-10-CM | POA: Diagnosis not present

## 2017-08-06 HISTORY — DX: Personal history of other diseases of the respiratory system: Z87.09

## 2017-08-06 HISTORY — DX: Phimosis: N47.1

## 2017-08-06 HISTORY — DX: Dyspnea, unspecified: R06.00

## 2017-08-06 HISTORY — DX: Benign prostatic hyperplasia with lower urinary tract symptoms: N40.1

## 2017-08-06 HISTORY — DX: Personal history of colonic polyps: Z86.010

## 2017-08-06 HISTORY — DX: Diverticulosis of large intestine without perforation or abscess without bleeding: K57.30

## 2017-08-06 HISTORY — DX: Chronic obstructive pulmonary disease, unspecified: J44.9

## 2017-08-06 HISTORY — DX: Other forms of dyspnea: R06.09

## 2017-08-06 HISTORY — PX: CIRCUMCISION: SHX1350

## 2017-08-06 HISTORY — DX: Personal history of other diseases of urinary system: Z87.448

## 2017-08-06 HISTORY — DX: Essential (primary) hypertension: I10

## 2017-08-06 HISTORY — DX: Personal history of other medical treatment: Z92.89

## 2017-08-06 LAB — POCT I-STAT, CHEM 8
BUN: 19 mg/dL (ref 6–20)
CALCIUM ION: 1.24 mmol/L (ref 1.15–1.40)
CHLORIDE: 105 mmol/L (ref 101–111)
Creatinine, Ser: 1.6 mg/dL — ABNORMAL HIGH (ref 0.61–1.24)
GLUCOSE: 116 mg/dL — AB (ref 65–99)
HCT: 43 % (ref 39.0–52.0)
HEMOGLOBIN: 14.6 g/dL (ref 13.0–17.0)
Potassium: 4.2 mmol/L (ref 3.5–5.1)
SODIUM: 143 mmol/L (ref 135–145)
TCO2: 24 mmol/L (ref 22–32)

## 2017-08-06 SURGERY — CIRCUMCISION, ADULT
Anesthesia: General | Site: Penis

## 2017-08-06 MED ORDER — ONDANSETRON HCL 4 MG/2ML IJ SOLN
INTRAMUSCULAR | Status: DC | PRN
  Administered 2017-08-06: 4 mg via INTRAVENOUS

## 2017-08-06 MED ORDER — KETOROLAC TROMETHAMINE 30 MG/ML IJ SOLN
INTRAMUSCULAR | Status: DC | PRN
  Administered 2017-08-06: 30 mg via INTRAVENOUS

## 2017-08-06 MED ORDER — PROPOFOL 10 MG/ML IV BOLUS
INTRAVENOUS | Status: DC | PRN
  Administered 2017-08-06: 200 mg via INTRAVENOUS

## 2017-08-06 MED ORDER — CEFAZOLIN SODIUM-DEXTROSE 2-4 GM/100ML-% IV SOLN
2.0000 g | INTRAVENOUS | Status: AC
  Administered 2017-08-06: 2 g via INTRAVENOUS
  Filled 2017-08-06: qty 100

## 2017-08-06 MED ORDER — BUPIVACAINE HCL (PF) 0.25 % IJ SOLN
INTRAMUSCULAR | Status: DC | PRN
  Administered 2017-08-06: 10 mL

## 2017-08-06 MED ORDER — TRAMADOL HCL 50 MG PO TABS: 50.0000 mg | ORAL_TABLET | Freq: Four times a day (QID) | ORAL | 0 refills | Status: AC | PRN

## 2017-08-06 MED ORDER — DEXAMETHASONE SODIUM PHOSPHATE 10 MG/ML IJ SOLN
INTRAMUSCULAR | Status: DC | PRN
  Administered 2017-08-06: 10 mg via INTRAVENOUS

## 2017-08-06 MED ORDER — FENTANYL CITRATE (PF) 100 MCG/2ML IJ SOLN
INTRAMUSCULAR | Status: DC | PRN
  Administered 2017-08-06: 50 ug via INTRAVENOUS

## 2017-08-06 MED ORDER — CEFAZOLIN SODIUM-DEXTROSE 2-4 GM/100ML-% IV SOLN
INTRAVENOUS | Status: AC
  Filled 2017-08-06: qty 100

## 2017-08-06 MED ORDER — LIDOCAINE 2% (20 MG/ML) 5 ML SYRINGE
INTRAMUSCULAR | Status: AC
  Filled 2017-08-06: qty 10

## 2017-08-06 MED ORDER — EPHEDRINE 5 MG/ML INJ
INTRAVENOUS | Status: AC
  Filled 2017-08-06: qty 10

## 2017-08-06 MED ORDER — EPHEDRINE 5 MG/ML INJ
INTRAVENOUS | Status: AC
  Filled 2017-08-06: qty 20

## 2017-08-06 MED ORDER — FENTANYL CITRATE (PF) 100 MCG/2ML IJ SOLN
INTRAMUSCULAR | Status: AC
  Filled 2017-08-06: qty 2

## 2017-08-06 MED ORDER — LACTATED RINGERS IV SOLN
INTRAVENOUS | Status: DC
  Administered 2017-08-06 (×2): via INTRAVENOUS
  Filled 2017-08-06: qty 1000

## 2017-08-06 MED ORDER — SODIUM CHLORIDE 0.9 % IR SOLN
Status: DC | PRN
  Administered 2017-08-06: 500 mL

## 2017-08-06 MED ORDER — LIDOCAINE 2% (20 MG/ML) 5 ML SYRINGE
INTRAMUSCULAR | Status: DC | PRN
  Administered 2017-08-06: 75 mg via INTRAVENOUS

## 2017-08-06 SURGICAL SUPPLY — 33 items
BANDAGE CO FLEX L/F 2IN X 5YD (GAUZE/BANDAGES/DRESSINGS) ×3 IMPLANT
BANDAGE COBAN STERILE 2 (GAUZE/BANDAGES/DRESSINGS) ×3 IMPLANT
BANDAGE CONFORM 2X5YD N/S (GAUZE/BANDAGES/DRESSINGS) IMPLANT
BLADE SURG 15 STRL LF DISP TIS (BLADE) ×1 IMPLANT
BLADE SURG 15 STRL SS (BLADE) ×2
BNDG CONFORM 2 STRL LF (GAUZE/BANDAGES/DRESSINGS) ×3 IMPLANT
CLEANER CAUTERY TIP 5X5 PAD (MISCELLANEOUS) ×1 IMPLANT
COVER BACK TABLE 60X90IN (DRAPES) ×3 IMPLANT
COVER MAYO STAND STRL (DRAPES) ×3 IMPLANT
DRAPE LAPAROTOMY 100X72 PEDS (DRAPES) ×3 IMPLANT
ELECT NEEDLE TIP 2.8 STRL (NEEDLE) IMPLANT
ELECT REM PT RETURN 9FT ADLT (ELECTROSURGICAL) ×3
ELECTRODE REM PT RTRN 9FT ADLT (ELECTROSURGICAL) ×1 IMPLANT
GAUZE XEROFORM 1X8 LF (GAUZE/BANDAGES/DRESSINGS) ×3 IMPLANT
GLOVE SURG SS PI 8.0 STRL IVOR (GLOVE) ×3 IMPLANT
GOWN STRL REUS W/ TWL LRG LVL3 (GOWN DISPOSABLE) ×1 IMPLANT
GOWN STRL REUS W/ TWL XL LVL3 (GOWN DISPOSABLE) ×1 IMPLANT
GOWN STRL REUS W/TWL LRG LVL3 (GOWN DISPOSABLE) ×2
GOWN STRL REUS W/TWL XL LVL3 (GOWN DISPOSABLE) ×2
KIT RM TURNOVER CYSTO AR (KITS) ×3 IMPLANT
MANIFOLD NEPTUNE II (INSTRUMENTS) IMPLANT
NEEDLE HYPO 25X1 1.5 SAFETY (NEEDLE) ×3 IMPLANT
NS IRRIG 500ML POUR BTL (IV SOLUTION) ×3 IMPLANT
PACK BASIN DAY SURGERY FS (CUSTOM PROCEDURE TRAY) ×3 IMPLANT
PAD CLEANER CAUTERY TIP 5X5 (MISCELLANEOUS) ×2
PENCIL BUTTON HOLSTER BLD 10FT (ELECTRODE) ×3 IMPLANT
SUT CHROMIC 4 0 PS 2 18 (SUTURE) ×6 IMPLANT
SYR CONTROL 10ML LL (SYRINGE) ×3 IMPLANT
TOWEL OR 17X24 6PK STRL BLUE (TOWEL DISPOSABLE) ×6 IMPLANT
TRAY DSU PREP LF (CUSTOM PROCEDURE TRAY) ×3 IMPLANT
TUBE CONNECTING 12'X1/4 (SUCTIONS)
TUBE CONNECTING 12X1/4 (SUCTIONS) IMPLANT
WATER STERILE IRR 500ML POUR (IV SOLUTION) IMPLANT

## 2017-08-06 NOTE — Anesthesia Postprocedure Evaluation (Signed)
Anesthesia Post Note  Patient: Martin Cortez  Procedure(s) Performed: Procedure(s) (LRB): CIRCUMCISION ADULT (N/A)     Patient location during evaluation: PACU Anesthesia Type: General Level of consciousness: awake Pain management: pain level controlled Vital Signs Assessment: post-procedure vital signs reviewed and stable Respiratory status: spontaneous breathing Cardiovascular status: stable Postop Assessment: no signs of nausea or vomiting Anesthetic complications: no    Last Vitals:  Vitals:   08/06/17 1130 08/06/17 1145  BP: (!) 141/76 (!) 146/91  Pulse: 68 65  Resp: 15 17  Temp:    SpO2: 97% 97%    Last Pain:  Vitals:   08/06/17 1121  TempSrc:   PainSc: Asleep   Pain Goal: Patients Stated Pain Goal: 7 (08/06/17 0843)               Aamari Strawderman JR,JOHN Mateo Flow

## 2017-08-06 NOTE — Anesthesia Preprocedure Evaluation (Addendum)
Anesthesia Evaluation  Patient identified by MRN, date of birth, ID band Patient awake    Reviewed: Allergy & Precautions, NPO status , Patient's Chart, lab work & pertinent test results  Airway Mallampati: II  TM Distance: >3 FB Neck ROM: Full    Dental no notable dental hx.    Pulmonary COPD, former smoker,    breath sounds clear to auscultation + decreased breath sounds      Cardiovascular hypertension, Pt. on medications and Pt. on home beta blockers Normal cardiovascular exam Rhythm:Regular Rate:Normal     Neuro/Psych negative neurological ROS  negative psych ROS   GI/Hepatic negative GI ROS, Neg liver ROS,   Endo/Other  negative endocrine ROS  Renal/GU negative Renal ROS  negative genitourinary   Musculoskeletal negative musculoskeletal ROS (+) Arthritis , Osteoarthritis,    Abdominal   Peds negative pediatric ROS (+)  Hematology negative hematology ROS (+)   Anesthesia Other Findings Hispanic interpretor at bedside  Reproductive/Obstetrics negative OB ROS                            Anesthesia Physical Anesthesia Plan  ASA: III  Anesthesia Plan: General   Post-op Pain Management:    Induction: Intravenous  PONV Risk Score and Plan: 1 and Ondansetron and Dexamethasone  Airway Management Planned: LMA  Additional Equipment:   Intra-op Plan:   Post-operative Plan: Extubation in OR  Informed Consent: I have reviewed the patients History and Physical, chart, labs and discussed the procedure including the risks, benefits and alternatives for the proposed anesthesia with the patient or authorized representative who has indicated his/her understanding and acceptance.   Dental advisory given  Plan Discussed with: CRNA and Surgeon  Anesthesia Plan Comments:         Anesthesia Quick Evaluation

## 2017-08-06 NOTE — Op Note (Signed)
  Preoperative diagnosis: 1. Phimosis  Postoperative diagnosis:  1. Phimosis  Procedure:  1. Circumcision  Surgeon: Irine Seal. M.D.  Anesthesia: general, with penile block    Complications: None  EBL: 42ml  Specimens: 1. Foreskin  Disposition of specimens: discarded  Indications:  This patient is to undergo circumcision for symptomatic phimosis.  Procedure:   The patient was placed in the supine position and anesthesia was induced.  The genitalia was prepped with betadine solution and he was draped in the usual sterile fashion.   A penile block was performed with 8cc of 0.25% marcaine.  The redundant foreskin was excised using the dorsal and ventral slit with wing resection technique and hemostasis was achieved with the Bovie.  The skin edges were reapproximated with a running 4-0 chromic that was tied at the quadrants.  The wound was cleaned and dried.  A dressing of Xeroform, Kling and Coban was applied.  The anesthetic was reversed and he was taken to the recovery room in stable condition.  There were no complications.

## 2017-08-06 NOTE — Discharge Instructions (Addendum)
Circumcision-Home Care Instructions  The following instructions have been prepared to help you care for yourself upon your return home today.   Wound Care & Hygiene:   You may apply ice to the penis.  This may help to decrease swelling.  Remove the dressing tomorrow.  If the dressing falls off before then, leave it off.  You may shower or bathe in 48 hours  Gently wash the penis with soap and water.  The stitches do not need to be removed.  Activity:  Do not drive or operate any equipment today.  The effects of anesthesia are still present, drowsiness may result.  Rest today, not necessarily flat bed rest, just take it easy.  You may resume your normal activity in one to two days or as indicated by your physician.  Sexual Activity:  Erection and sexual relations should be avoided for *2 weeks.  Return to Work:  1 week.  Diet:  Drink liquids or eat a very light diet this evening.  You may resume a regular diet tomorrow.  General Expectations of your surgery:   You may have a small amount of bleeding  The penis will be swollen and bruised for approximately one week  You may wake during the night with an erection, usually this is caused by having a full bladder so you should try to urinate (pass your water) to relieve the erection or apply ice to the penis  Unexpected Observations - Call your doctor if these occur!  Persistent or heavy bleeding  Temperature of 101 degrees or more  Severe pain not relieved by medication  Return to Office  08/20/17 at 10am  Additional Instructions:  You may remove the dressing in the morning if it has not already come off.  If it comes off leave it off.   You should be clear to travel after you f/u appointment on 08/20/17.   Post Anesthesia Home Care Instructions  Activity: Get plenty of rest for the remainder of the day. A responsible individual must stay with you for 24 hours following the procedure.  For the next 24 hours, DO NOT: -Drive a  car -Paediatric nurse -Drink alcoholic beverages -Take any medication unless instructed by your physician -Make any legal decisions or sign important papers.  Meals: Start with liquid foods such as gelatin or soup. Progress to regular foods as tolerated. Avoid greasy, spicy, heavy foods. If nausea and/or vomiting occur, drink only clear liquids until the nausea and/or vomiting subsides. Call your physician if vomiting continues.  Special Instructions/Symptoms: Your throat may feel dry or sore from the anesthesia or the breathing tube placed in your throat during surgery. If this causes discomfort, gargle with warm salt water. The discomfort should disappear within 24 hours.  If you had a scopolamine patch placed behind your ear for the management of post- operative nausea and/or vomiting:  1. The medication in the patch is effective for 72 hours, after which it should be removed.  Wrap patch in a tissue and discard in the trash. Wash hands thoroughly with soap and water. 2. You may remove the patch earlier than 72 hours if you experience unpleasant side effects which may include dry mouth, dizziness or visual disturbances. 3. Avoid touching the patch. Wash your hands with soap and water after contact with the patch.

## 2017-08-06 NOTE — Anesthesia Procedure Notes (Signed)
Procedure Name: LMA Insertion Date/Time: 08/06/2017 10:33 AM Performed by: Wanita Chamberlain Pre-anesthesia Checklist: Patient identified, Emergency Drugs available, Suction available and Patient being monitored Patient Re-evaluated:Patient Re-evaluated prior to induction Oxygen Delivery Method: Circle system utilized Preoxygenation: Pre-oxygenation with 100% oxygen Induction Type: IV induction Ventilation: Mask ventilation without difficulty LMA: LMA inserted LMA Size: 5.0 Number of attempts: 1 Placement Confirmation: breath sounds checked- equal and bilateral and positive ETCO2 Tube secured with: Tape Dental Injury: Teeth and Oropharynx as per pre-operative assessment

## 2017-08-06 NOTE — Interval H&P Note (Signed)
History and Physical Interval Note:  08/06/2017 9:07 AM  Martin Cortez  has presented today for surgery, with the diagnosis of PHIMOSIS  The various methods of treatment have been discussed with the patient and family. After consideration of risks, benefits and other options for treatment, the patient has consented to  Procedure(s): CIRCUMCISION ADULT (N/A) as a surgical intervention .  The patient's history has been reviewed, patient examined, no change in status, stable for surgery.  I have reviewed the patient's chart and labs.  Questions were answered to the patient's satisfaction.     Tabbetha Kutscher J

## 2017-08-06 NOTE — Transfer of Care (Signed)
Immediate Anesthesia Transfer of Care Note  Patient: Martin Cortez  Procedure(s) Performed: Procedure(s): CIRCUMCISION ADULT (N/A)  Patient Location: PACU  Anesthesia Type:General  Level of Consciousness: awake, alert , oriented and patient cooperative  Airway & Oxygen Therapy: Patient Spontanous Breathing and Patient connected to nasal cannula oxygen  Post-op Assessment: Report given to RN and Post -op Vital signs reviewed and stable  Post vital signs: Reviewed and stable  Last Vitals:  Vitals:   08/06/17 0736  BP: (!) 168/86  Pulse: 77  Resp: 18  Temp: 36.4 C  SpO2: 98%    Last Pain:  Vitals:   08/06/17 0843  TempSrc:   PainSc: 6       Patients Stated Pain Goal: 7 (67/67/20 9470)  Complications: No apparent anesthesia complications

## 2017-08-06 NOTE — Progress Notes (Signed)
  Pt states that he is not allergic to vancomycin.  States that he has recently had a rash after being on some medications, and denies any allergies.

## 2017-08-07 ENCOUNTER — Encounter (HOSPITAL_BASED_OUTPATIENT_CLINIC_OR_DEPARTMENT_OTHER): Payer: Self-pay | Admitting: Urology

## 2017-08-27 ENCOUNTER — Emergency Department (HOSPITAL_COMMUNITY): Payer: Medicare Other

## 2017-08-27 ENCOUNTER — Other Ambulatory Visit: Payer: Self-pay

## 2017-08-27 ENCOUNTER — Emergency Department (HOSPITAL_COMMUNITY)
Admission: EM | Admit: 2017-08-27 | Discharge: 2017-08-27 | Disposition: A | Discharge status: Home / Self Care | Payer: Medicare Other | Attending: Emergency Medicine | Admitting: Emergency Medicine

## 2017-08-27 ENCOUNTER — Encounter (HOSPITAL_COMMUNITY): Payer: Self-pay | Admitting: Emergency Medicine

## 2017-08-27 DIAGNOSIS — Z79899 Other long term (current) drug therapy: Secondary | ICD-10-CM | POA: Insufficient documentation

## 2017-08-27 DIAGNOSIS — J069 Acute upper respiratory infection, unspecified: Secondary | ICD-10-CM | POA: Insufficient documentation

## 2017-08-27 DIAGNOSIS — J449 Chronic obstructive pulmonary disease, unspecified: Secondary | ICD-10-CM | POA: Diagnosis not present

## 2017-08-27 DIAGNOSIS — R079 Chest pain, unspecified: Secondary | ICD-10-CM | POA: Diagnosis present

## 2017-08-27 DIAGNOSIS — Z87891 Personal history of nicotine dependence: Secondary | ICD-10-CM | POA: Insufficient documentation

## 2017-08-27 DIAGNOSIS — I10 Essential (primary) hypertension: Secondary | ICD-10-CM | POA: Diagnosis not present

## 2017-08-27 LAB — CBC
HCT: 46.3 % (ref 39.0–52.0)
HEMOGLOBIN: 15.4 g/dL (ref 13.0–17.0)
MCH: 28.6 pg (ref 26.0–34.0)
MCHC: 33.3 g/dL (ref 30.0–36.0)
MCV: 85.9 fL (ref 78.0–100.0)
Platelets: 192 10*3/uL (ref 150–400)
RBC: 5.39 MIL/uL (ref 4.22–5.81)
RDW: 13.6 % (ref 11.5–15.5)
WBC: 7 10*3/uL (ref 4.0–10.5)

## 2017-08-27 LAB — BASIC METABOLIC PANEL
ANION GAP: 9 (ref 5–15)
BUN: 26 mg/dL — ABNORMAL HIGH (ref 6–20)
CALCIUM: 9.4 mg/dL (ref 8.9–10.3)
CO2: 23 mmol/L (ref 22–32)
Chloride: 108 mmol/L (ref 101–111)
Creatinine, Ser: 1.71 mg/dL — ABNORMAL HIGH (ref 0.61–1.24)
GFR calc Af Amer: 43 mL/min — ABNORMAL LOW (ref >60)
GFR calc non Af Amer: 37 mL/min — ABNORMAL LOW (ref >60)
GLUCOSE: 198 mg/dL — AB (ref 65–99)
Potassium: 4.2 mmol/L (ref 3.5–5.1)
Sodium: 140 mmol/L (ref 135–145)

## 2017-08-27 LAB — TROPONIN I

## 2017-08-27 MED ORDER — PREDNISONE 20 MG PO TABS
40.0000 mg | ORAL_TABLET | Freq: Once | ORAL | Status: AC
  Administered 2017-08-27: 40 mg via ORAL
  Filled 2017-08-27: qty 2

## 2017-08-27 MED ORDER — AZITHROMYCIN 250 MG PO TABS
500.0000 mg | ORAL_TABLET | Freq: Once | ORAL | Status: AC
  Administered 2017-08-27: 500 mg via ORAL
  Filled 2017-08-27: qty 2

## 2017-08-27 MED ORDER — PREDNISONE 10 MG PO TABS: 10.0000 mg | ORAL_TABLET | Freq: Every day | ORAL | 0 refills | Status: AC

## 2017-08-27 MED ORDER — AZITHROMYCIN 250 MG PO TABS: ORAL_TABLET | ORAL | 0 refills | Status: AC

## 2017-08-27 MED ORDER — ALBUTEROL SULFATE HFA 108 (90 BASE) MCG/ACT IN AERS
2.0000 | INHALATION_SPRAY | RESPIRATORY_TRACT | Status: DC
  Administered 2017-08-27: 2 via RESPIRATORY_TRACT
  Filled 2017-08-27: qty 6.7

## 2017-08-27 NOTE — ED Triage Notes (Signed)
Pt was seen by NP in [place] today here, wheezing lung sounds

## 2017-08-27 NOTE — ED Notes (Signed)
ED Provider at bedside. 

## 2017-08-27 NOTE — ED Triage Notes (Signed)
Chest pain the last 5 days, 8/10, SOB,

## 2017-08-27 NOTE — Discharge Instructions (Signed)
Chest x-ray showed no obvious pneumonia. However, I believe you have an upper respiratory tract infection. Will start antibiotic and prednisone. You were also given an inhaler. Increase fluids. Follow-up your primary care doctor.

## 2017-08-27 NOTE — ED Provider Notes (Signed)
[facility] DEPT Provider Note   CSN: [id number] Arrival date & time: 08/27/17  1543     History   Chief Complaint Chief Complaint  Patient presents with  . Chest Pain    HPI Martin Cortez is a 76 y.o. male.  Productive cough and wheezing for 6 days with questionable fever. Patient quit smoking 3 years ago. Patient has known dyspnea on exertion and COPD. Patient was seen by his primary care physician today and sent to the ED. He is ambulatory. No fever, sweats, chills.  Chest pain with cough.  Severity of symptoms mild to moderate.      Past Medical History:  Diagnosis Date  . Arthritis   . Benign localized prostatic hyperplasia with lower urinary tract symptoms (LUTS)   . Bladder stone   . Diverticulosis of colon   . Dyspnea on exertion   . History of adenomatous polyp of colon 03/08/2016   tubular adenoma's  . History of bladder stone   . History of pleural empyema 07/2016   s/p  VATS   . History of positive PPD    per documentation by physician's from pt/ family -- pt treated for 3 months at [facility] 2016  . Hypertension   . Phimosis   . Stage 2 moderate COPD by GOLD classification [facility])    pulmologist-  dr wert    Patient Active Problem List   Diagnosis Date Noted  . Early satiety 11/15/2016  . Adhesions of foreskin 11/15/2016  . Prostate asymmetry 11/15/2016  . COPD GOLD II 11/04/2016  . Pulmonary infiltrates 11/04/2016  . Shortness of breath 11/02/2016  . Heme positive stool 10/01/2016  . Abdominal pain, epigastric 10/01/2016  . Normocytic anemia 10/01/2016  . Left rib fracture 07/03/2016  . Hilar lymphadenopathy 07/03/2016  . Hemothorax on left 06/29/2016  . History of colonic polyps   . Diverticulosis of colon without hemorrhage     Past Surgical History:  Procedure Laterality Date  . CIRCUMCISION N/A 08/06/2017   Procedure: CIRCUMCISION ADULT;  Surgeon: Irine Seal, MD;  Location: [facility];  Service: Urology;  Laterality: N/A;  . COLONOSCOPY N/A 03/08/2016   Dr. Gala Romney: 34-5 mm tubular adenomas removed. Diverticulosis. Next colonoscopy planned for March 2020  . CYSTOSCOPY WITH LITHOLAPAXY  06/07/09   [facility], [facility], spinal  . CYSTOSCOPY WITH LITHOLAPAXY  02/21/2012   Procedure: CYSTOSCOPY WITH LITHOLAPAXY;  Surgeon: Marissa Nestle, MD;  Location: [facility];  Service: Urology;  Laterality: N/A;  . THORACOTOMY  08/07/2016   [facility]: Left vertical thoracotomy, latissimus sparing, resection of the sixth rib, full visceral and parietal pleural decortication of the left upper lobe and left lower lobe. Placement of 20 French Blake drain pleural chest tubes 2,, submuscular 19 F blake drain x 1. MRSA.   [facility] Kitchen UMBILICAL HERNIA REPAIR  01/08/2011   incarcerated, Geroge Baseman, [facility]  . VIDEO ASSISTED THORACOSCOPY (VATS)/EMPYEMA  07/27/2016   [facility]: Bronchoscopy, left VATS and evacuation hemothorax with placement of 28 French Blake drain.       Home Medications    Prior to Admission medications   Medication Sig Start Date End Date Taking? Authorizing Provider  budesonide-formoterol (SYMBICORT) 80-4.5 MCG/ACT inhaler Inhale 2 puffs into the lungs 2 (two) times daily. 11/02/16  Yes Tanda Rockers, MD  metoprolol succinate (TOPROL-XL) 25 MG 24 hr tablet Take 25 mg by mouth daily.   Yes [provider]  pantoprazole (PROTONIX) 40 MG tablet Take 1 tablet (40 mg total) by mouth  daily before breakfast. 10/01/16  Yes Mahala Menghini, PA-C  simvastatin (ZOCOR) 40 MG tablet Take 40 mg by mouth daily.   Yes [provider]  azithromycin (ZITHROMAX) 250 MG tablet One daily for 4 days starting tomorrow evening 08/27/17   Nat Christen, MD  predniSONE (DELTASONE) 10 MG tablet Take 1 tablet (10 mg total) by mouth daily with breakfast. 2 tablets daily for 5 days, one tablet daily for 5 days 08/27/17   Nat Christen, MD  traMADol (ULTRAM) 50 MG tablet Take 1 tablet (50 mg total) by mouth every 6  (six) hours as needed. Patient not taking: Reported on 08/27/2017 08/06/17   Irine Seal, MD    Family History Family History  Problem Relation Age of Onset  . Colon cancer Neg Hx     Social History Social History  Substance Use Topics  . Smoking status: Former Research scientist (life sciences)  . Smokeless tobacco: Never Used     Comment: pt states unsure when he quit smoking  . Alcohol use No     Allergies   Vancomycin   Review of Systems Review of Systems  All other systems reviewed and are negative.    Physical Exam Updated Vital Signs BP (!) 150/81 (BP Location: Right Arm) Comment: Simultaneous filing. User may not have seen previous data.  Pulse 87   Temp 98.2 F (36.8 C) (Oral)   Resp (!) 22   Ht 5\' 6"  (1.676 m)   Wt 90.3 kg (199 lb)   SpO2 100%   BMI 32.12 kg/m   Physical Exam  Constitutional: He is oriented to person, place, and time. He appears well-developed and well-nourished.  No acute distress, pleasant, alert, no dyspnea.  HENT:  Head: Normocephalic and atraumatic.  Eyes: Conjunctivae are normal.  Neck: Neck supple.  Cardiovascular: Normal rate and regular rhythm.   Pulmonary/Chest: Effort normal.  Scattered rhonchi bilaterally.  Abdominal: Soft. Bowel sounds are normal.  Musculoskeletal: Normal range of motion.  Neurological: He is alert and oriented to person, place, and time.  Skin: Skin is warm and dry.  Psychiatric: He has a normal mood and affect. His behavior is normal.  Nursing note and vitals reviewed.    ED Treatments / Results  Labs (all labs ordered are listed, but only abnormal results are displayed) Labs Reviewed  BASIC METABOLIC PANEL - Abnormal; Notable for the following:       Result Value   Glucose, Bld 198 (*)    BUN 26 (*)    Creatinine, Ser 1.71 (*)    GFR calc non Af Amer 37 (*)    GFR calc Af Amer 43 (*)    All other components within normal limits  CBC  TROPONIN I    EKG  EKG Interpretation None       Radiology Dg Chest 2  View  Result Date: 08/27/2017 CLINICAL DATA:  Three-week history of off an on chest pain EXAM: CHEST  2 VIEW COMPARISON:  CT scan 09/27/2016.  Chest x-ray 07/01/2016. FINDINGS: Right lung is clear. The left pleural effusion and basilar airspace opacity seen in the previous chest x-ray has essentially resolved. There is some residual pleural-parenchymal scarring at the left lung base on today's exam. The cardiopericardial silhouette is within normal limits for size. Posttraumatic deformity left chest wall is similar to prior. IMPRESSION: No new or acute cardiopulmonary findings. Electronically Signed   By: Misty Stanley M.D.   On: 08/27/2017 16:20    Procedures Procedures (including critical care time)  Medications Ordered in ED Medications  albuterol (PROVENTIL HFA;VENTOLIN HFA) 108 (90 Base) MCG/ACT inhaler 2 puff (not administered)  azithromycin (ZITHROMAX) tablet 500 mg (500 mg Oral Given 08/27/17 2016)  predniSONE (DELTASONE) tablet 40 mg (40 mg Oral Given 08/27/17 2016)     Initial Impression / Assessment and Plan / ED Course  I have reviewed the triage vital signs and the nursing notes.  Pertinent labs & imaging results that were available during my care of the patient were reviewed by me and considered in my medical decision making (see chart for details).     History and physical consistent with upper respiratory infection. Chest x-ray reviewed.  No obvious pneumonia or heart failure. Will start Zithromax, prednisone, inhaler.  He has primary care follow-up  Final Clinical Impressions(s) / ED Diagnoses   Final diagnoses:  Upper respiratory tract infection, unspecified type    New Prescriptions New Prescriptions   AZITHROMYCIN (ZITHROMAX) 250 MG TABLET    One daily for 4 days starting tomorrow evening   PREDNISONE (DELTASONE) 10 MG TABLET    Take 1 tablet (10 mg total) by mouth daily with breakfast. 2 tablets daily for 5 days, one tablet daily for 5 days     Nat Christen,  MD 08/27/17 2035

## 2019-02-26 ENCOUNTER — Encounter: Payer: Self-pay | Admitting: Internal Medicine

## 2024-09-09 DEATH — deceased
